# Patient Record
Sex: Female | Born: 1935 | Race: White | Hispanic: No | Smoking: Former smoker
Health system: Southern US, Community
[De-identification: ages and names within clinical notes are randomized; demographics above are authoritative.]

## PROBLEM LIST (undated history)

## (undated) DIAGNOSIS — M810 Age-related osteoporosis without current pathological fracture: Secondary | ICD-10-CM

## (undated) DIAGNOSIS — I639 Cerebral infarction, unspecified: Secondary | ICD-10-CM

## (undated) DIAGNOSIS — I10 Essential (primary) hypertension: Secondary | ICD-10-CM

## (undated) DIAGNOSIS — K219 Gastro-esophageal reflux disease without esophagitis: Secondary | ICD-10-CM

## (undated) DIAGNOSIS — Z9989 Dependence on other enabling machines and devices: Secondary | ICD-10-CM

## (undated) HISTORY — PX: ABDOMINAL HYSTERECTOMY: SHX81

---

## 2004-08-21 ENCOUNTER — Ambulatory Visit: Payer: Self-pay | Admitting: Family Medicine

## 2004-11-28 ENCOUNTER — Ambulatory Visit: Payer: Self-pay | Admitting: Unknown Physician Specialty

## 2005-09-11 ENCOUNTER — Ambulatory Visit: Payer: Self-pay | Admitting: Family Medicine

## 2005-11-28 ENCOUNTER — Ambulatory Visit: Payer: Self-pay | Admitting: Ophthalmology

## 2005-12-03 ENCOUNTER — Ambulatory Visit: Payer: Self-pay | Admitting: Ophthalmology

## 2006-09-18 ENCOUNTER — Ambulatory Visit: Payer: Self-pay | Admitting: Family Medicine

## 2007-11-05 ENCOUNTER — Ambulatory Visit: Payer: Self-pay | Admitting: Family Medicine

## 2008-02-24 ENCOUNTER — Ambulatory Visit: Payer: Self-pay | Admitting: Family Medicine

## 2008-03-10 ENCOUNTER — Ambulatory Visit: Payer: Self-pay | Admitting: Gastroenterology

## 2008-12-22 ENCOUNTER — Inpatient Hospital Stay: Payer: Self-pay | Admitting: Internal Medicine

## 2009-01-04 ENCOUNTER — Ambulatory Visit: Payer: Self-pay | Admitting: Family Medicine

## 2009-01-11 ENCOUNTER — Encounter: Payer: Self-pay | Admitting: Family Medicine

## 2009-01-16 ENCOUNTER — Ambulatory Visit: Payer: Self-pay | Admitting: Family Medicine

## 2009-01-21 ENCOUNTER — Encounter: Payer: Self-pay | Admitting: Family Medicine

## 2009-06-12 ENCOUNTER — Inpatient Hospital Stay: Payer: Self-pay | Admitting: Internal Medicine

## 2009-12-12 ENCOUNTER — Ambulatory Visit: Payer: Self-pay | Admitting: Family Medicine

## 2009-12-18 ENCOUNTER — Inpatient Hospital Stay: Payer: Self-pay | Admitting: Internal Medicine

## 2010-01-05 ENCOUNTER — Ambulatory Visit: Payer: Self-pay | Admitting: Family Medicine

## 2010-01-29 ENCOUNTER — Ambulatory Visit: Payer: Self-pay | Admitting: Gastroenterology

## 2010-02-05 ENCOUNTER — Ambulatory Visit: Payer: Self-pay | Admitting: Family Medicine

## 2010-04-18 ENCOUNTER — Ambulatory Visit: Payer: Self-pay | Admitting: Family Medicine

## 2010-08-23 ENCOUNTER — Ambulatory Visit: Payer: Self-pay | Admitting: Internal Medicine

## 2010-09-11 ENCOUNTER — Ambulatory Visit: Payer: Self-pay | Admitting: Gastroenterology

## 2010-09-13 LAB — PATHOLOGY REPORT

## 2010-11-27 ENCOUNTER — Observation Stay: Payer: Self-pay | Admitting: Specialist

## 2011-02-22 ENCOUNTER — Other Ambulatory Visit: Payer: Self-pay | Admitting: Ophthalmology

## 2011-03-05 ENCOUNTER — Ambulatory Visit: Payer: Self-pay | Admitting: Gastroenterology

## 2011-04-09 ENCOUNTER — Emergency Department: Payer: Self-pay | Admitting: Unknown Physician Specialty

## 2011-04-30 ENCOUNTER — Ambulatory Visit: Payer: Self-pay | Admitting: Gastroenterology

## 2011-05-07 ENCOUNTER — Ambulatory Visit: Payer: Self-pay | Admitting: Family Medicine

## 2011-05-09 ENCOUNTER — Ambulatory Visit: Payer: Self-pay | Admitting: Family Medicine

## 2011-06-01 IMAGING — CR DG LUMBAR SPINE 2-3V
1 series · 3 of 3 positions shown · non-contrast
Comparison: none

REASON FOR EXAM: back pain osteopenia
COMMENTS:

[Series 1: view not recorded · 0.17mm/px · 3 of 3 slices shown]
[im 1/3]
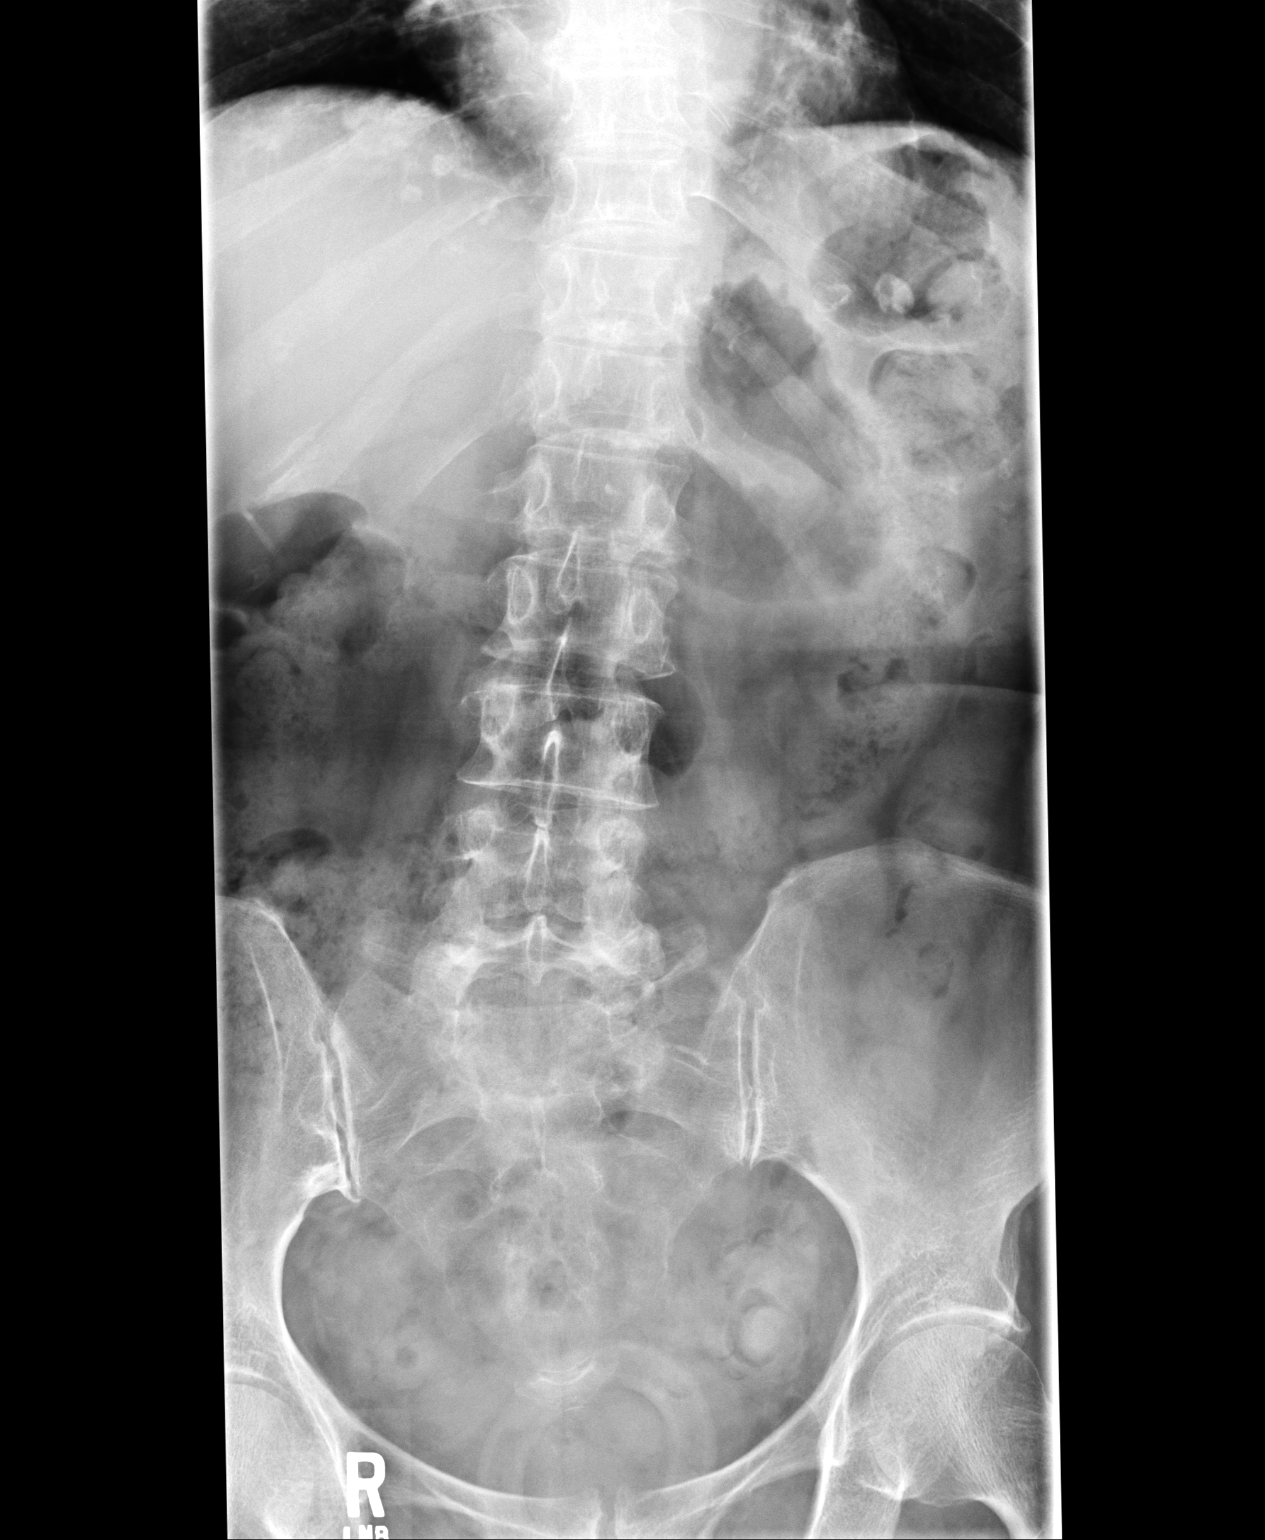
[im 2/3]
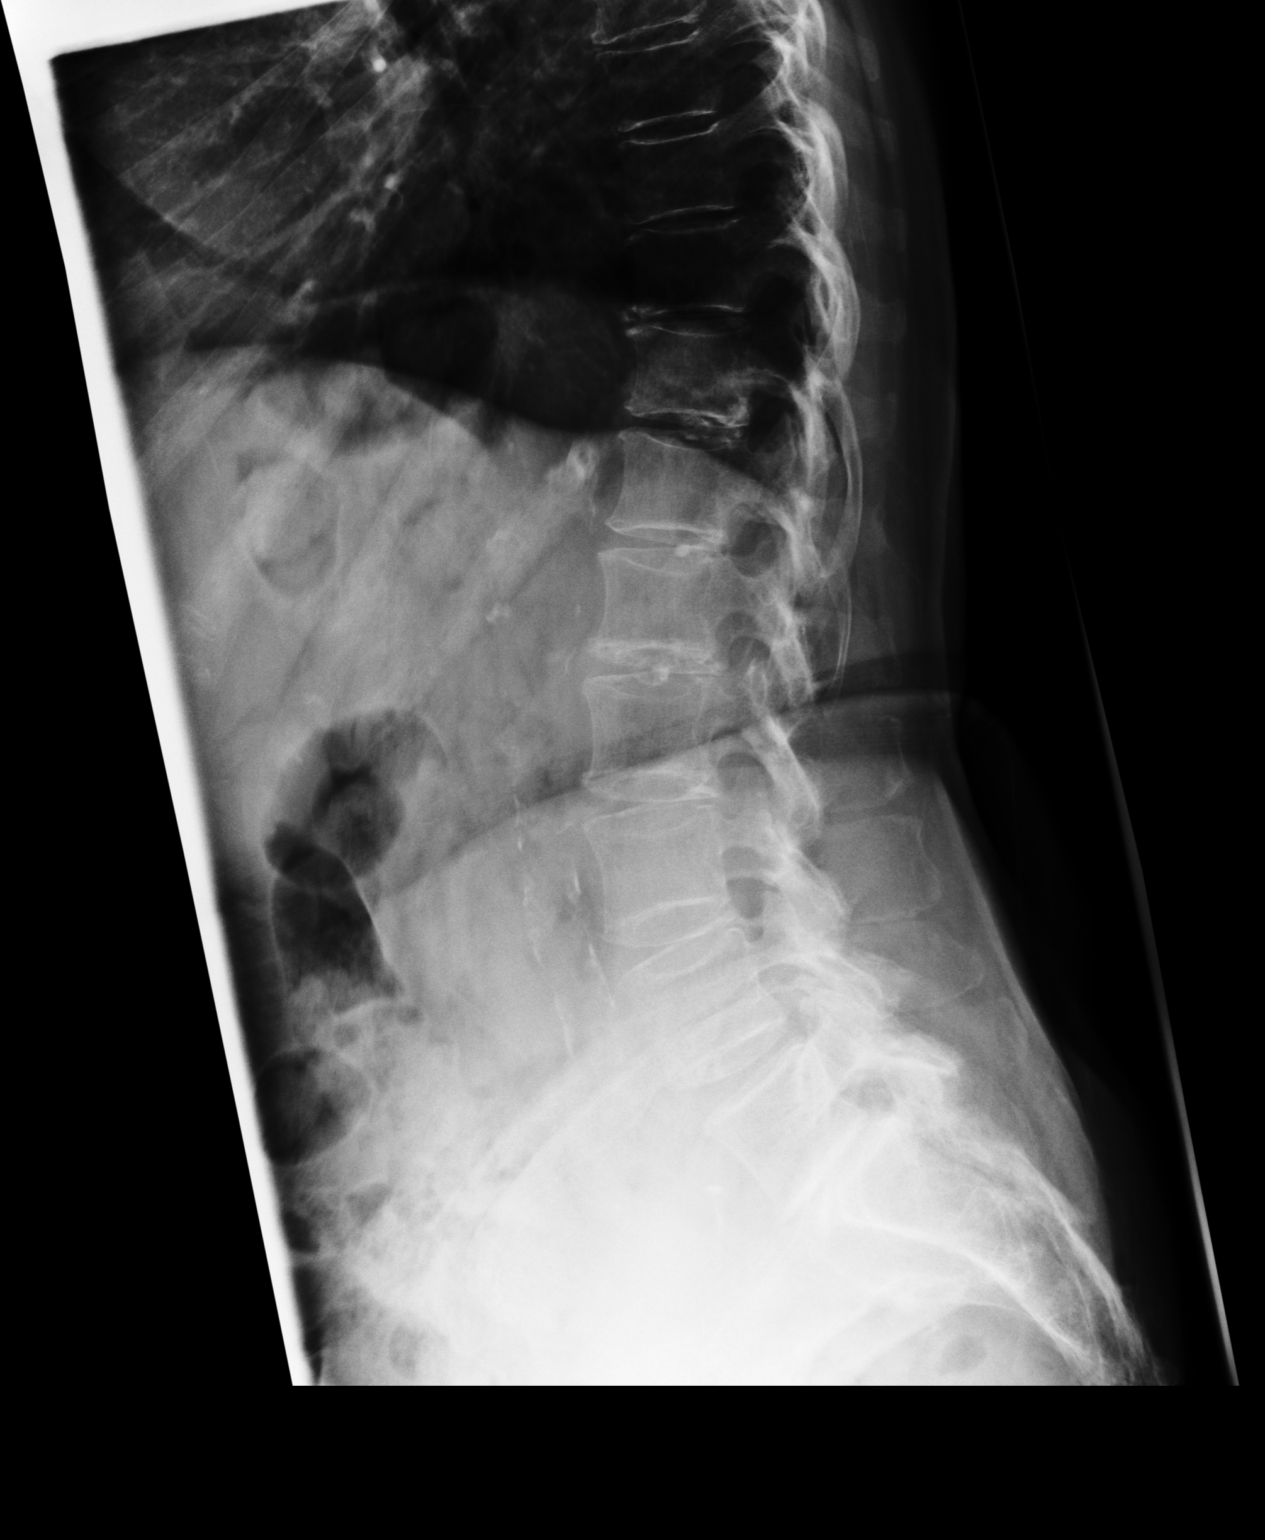
[im 3/3]
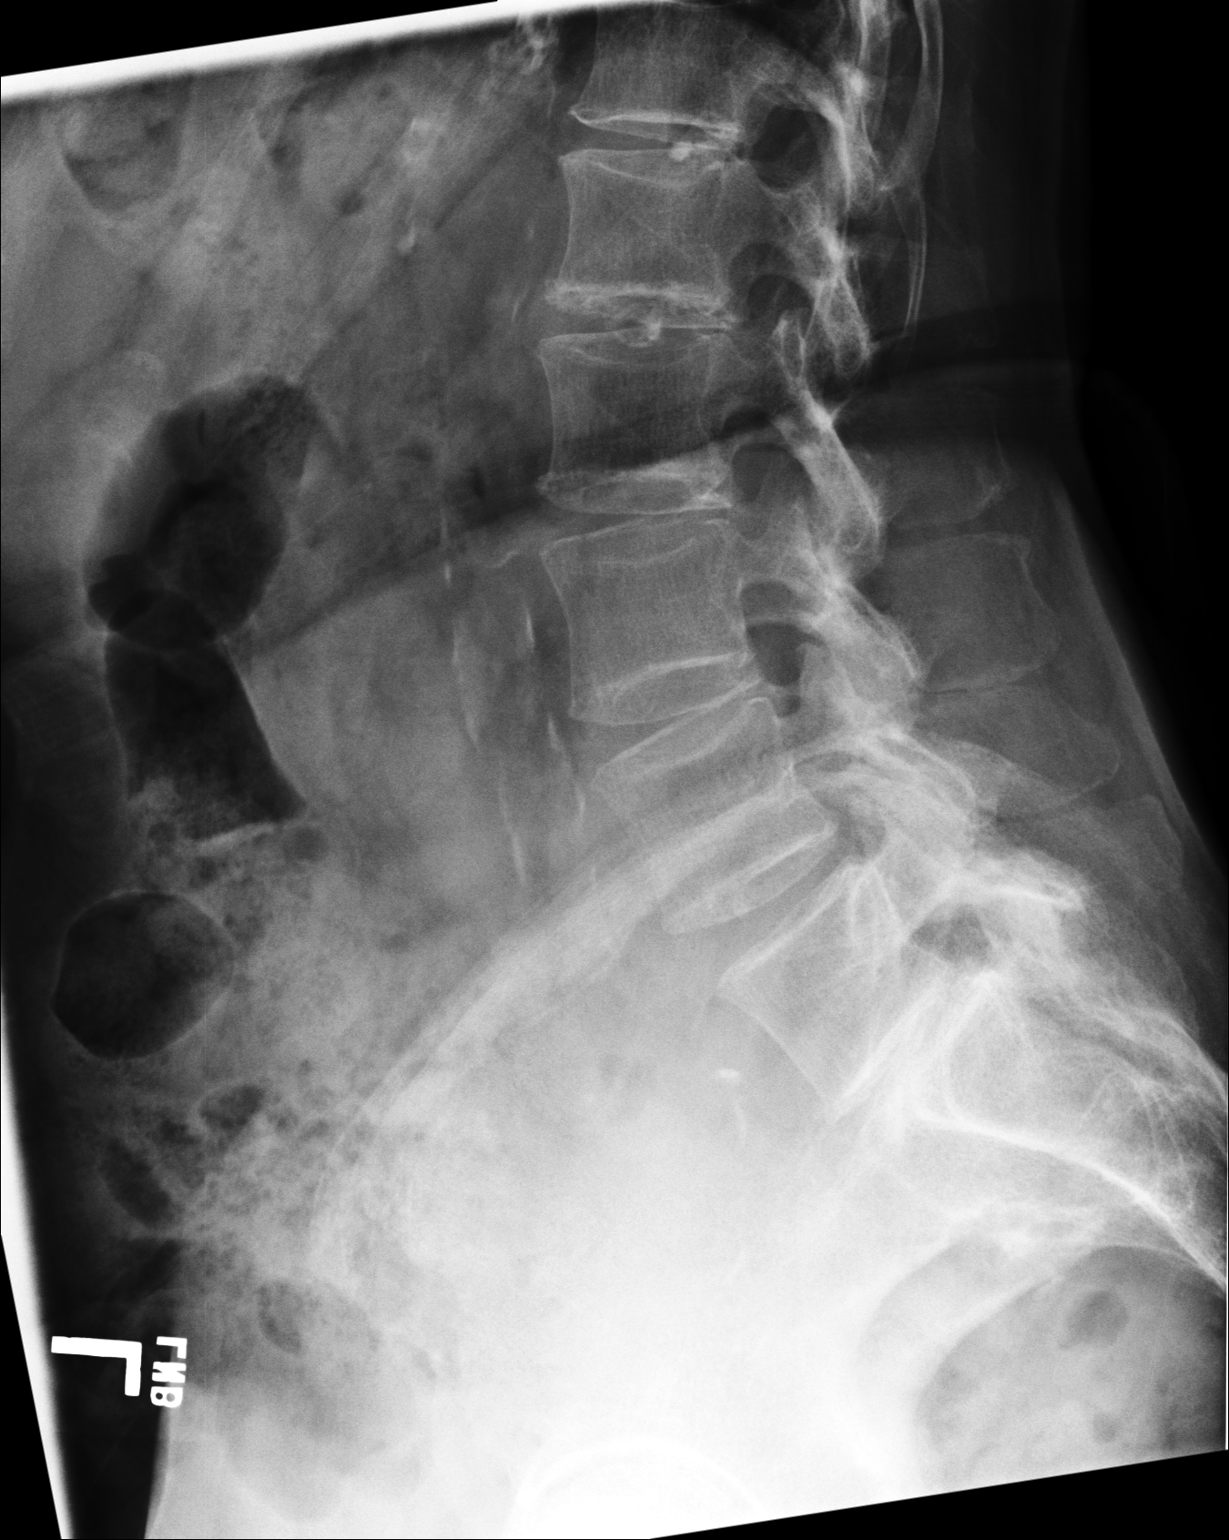

[3 of 3 positions shown; findings below may reference images not displayed]

PROCEDURE:     DXR - DXR LUMBAR SPINE AP AND LATERAL  - December 12, 2009 [DATE]

RESULT:     AP and lateral images demonstrate disc space narrowing at L5-S1
with no significant compression deformity. Atherosclerotic calcification and
facet hypertrophy are present. No definite anterolisthesis is present. There
is no congenital abnormality appreciated.
IMPRESSION: Degenerative changes in the lumbosacral region and lumbar
spine as described.

## 2011-06-01 IMAGING — CR CERVICAL SPINE - COMPLETE 4+ VIEW
1 series · 9 of 9 positions shown · non-contrast
Comparison: none

REASON FOR EXAM: back pain osteopenia
COMMENTS:

[Series 1: view not recorded · 0.17mm/px · 9 of 9 slices shown]
[im 1/9]
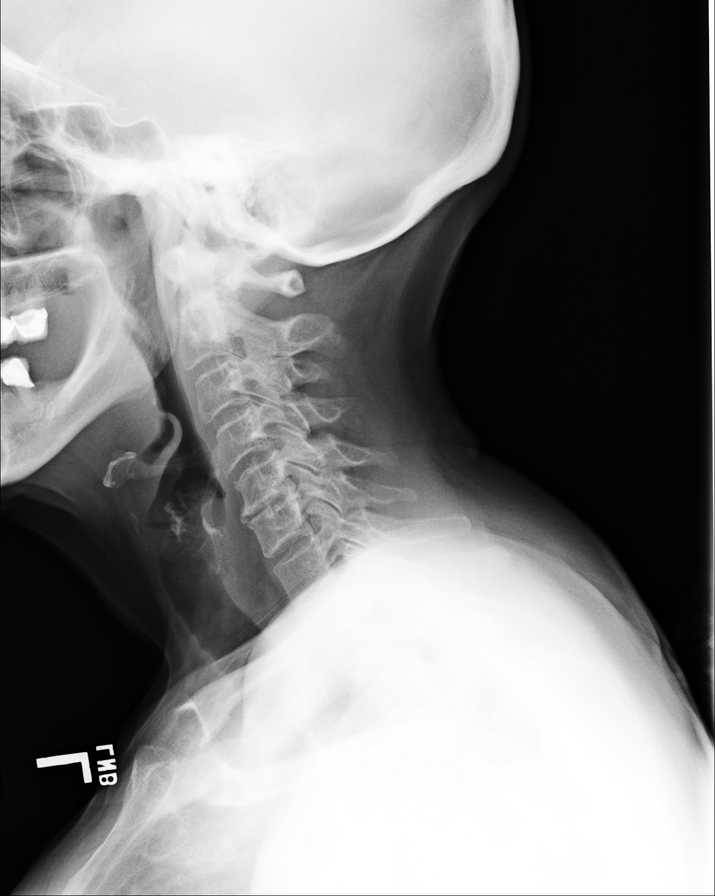
[im 2/9]
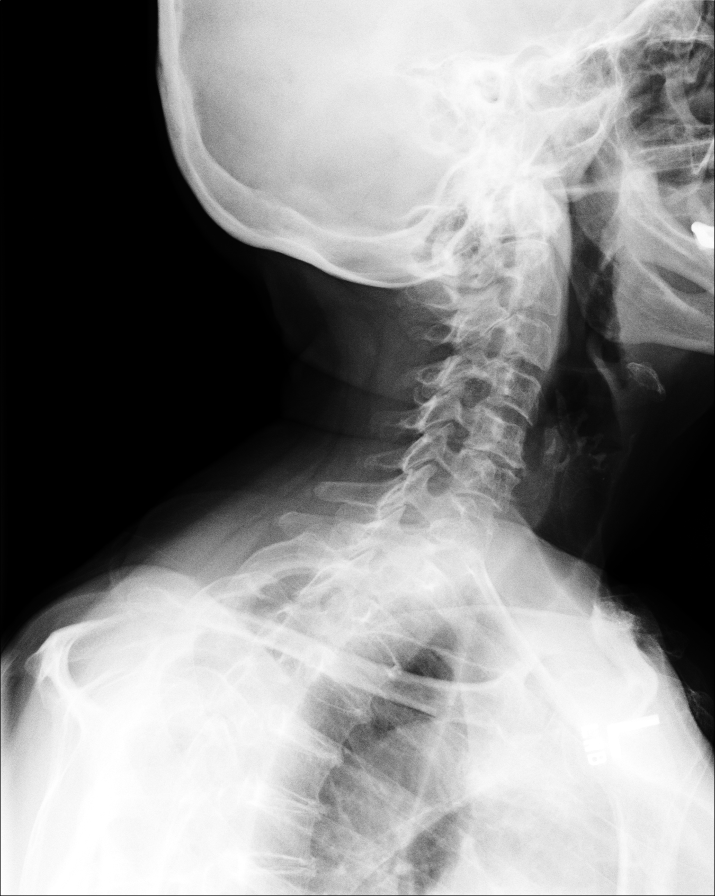
[im 3/9]
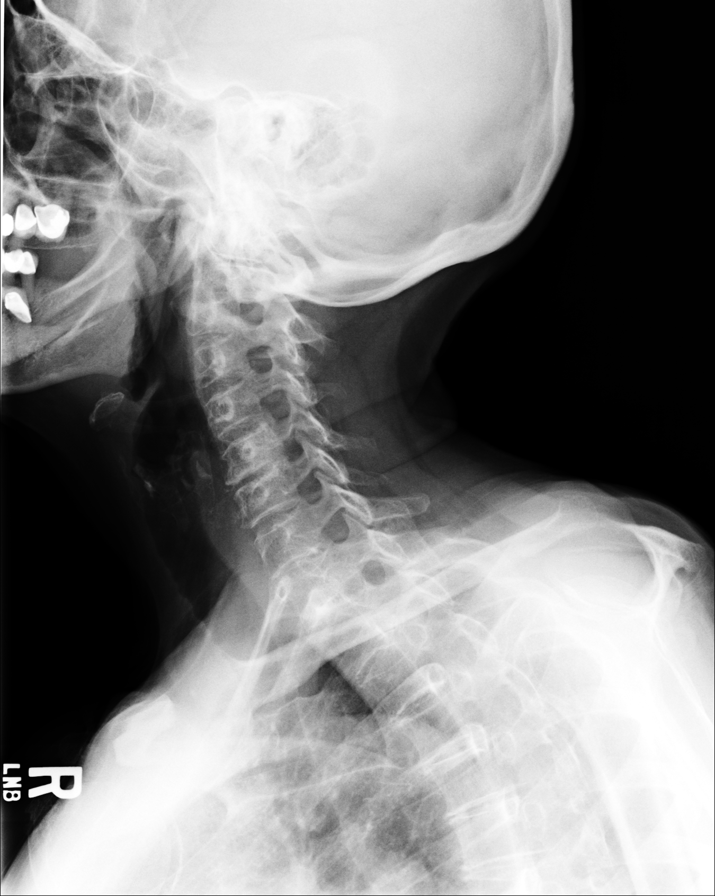
[im 4/9]
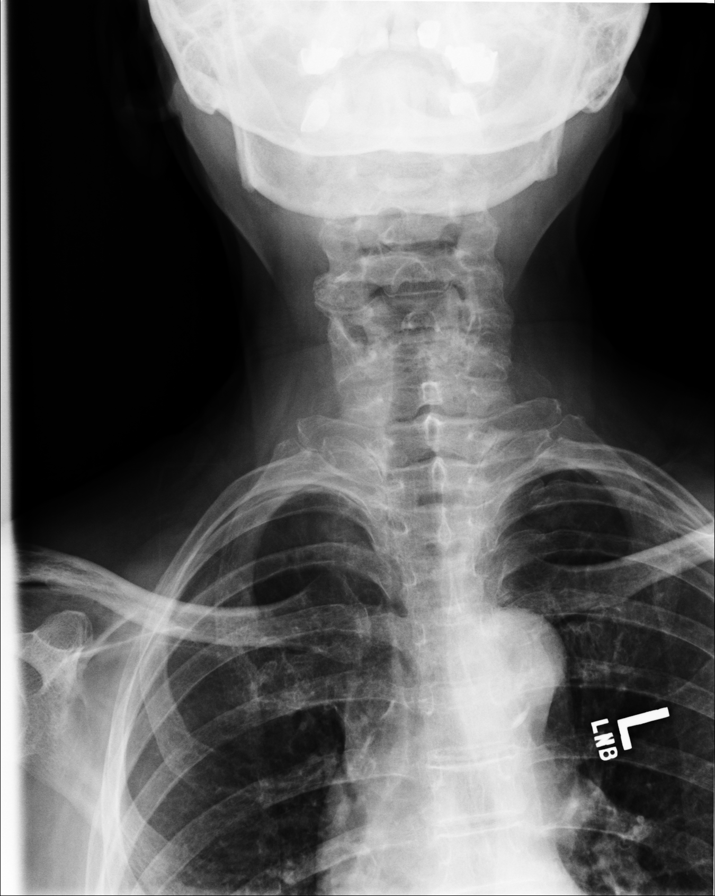
[im 5/9]
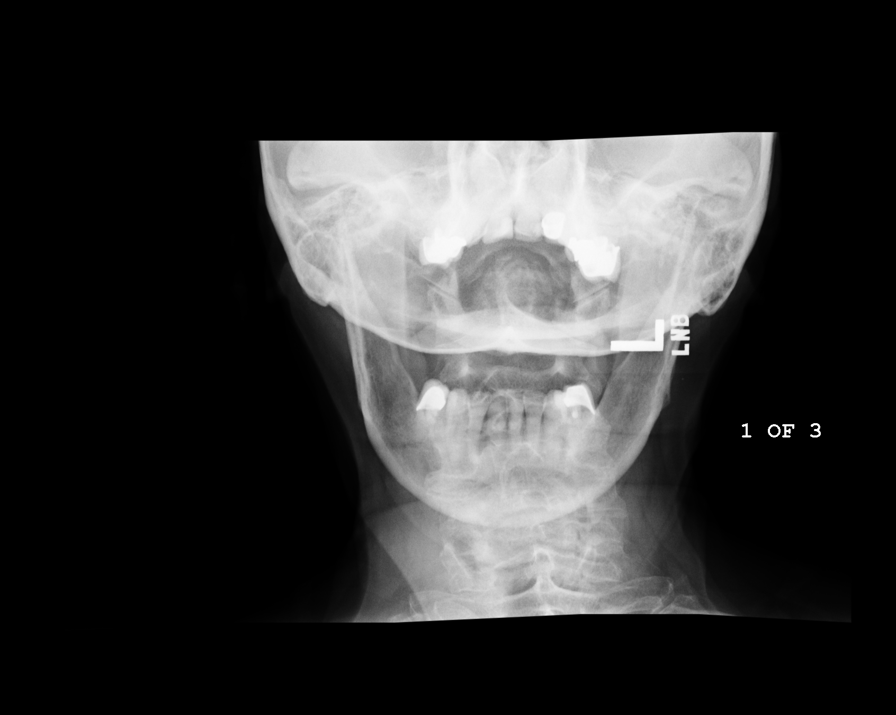
[im 6/9]
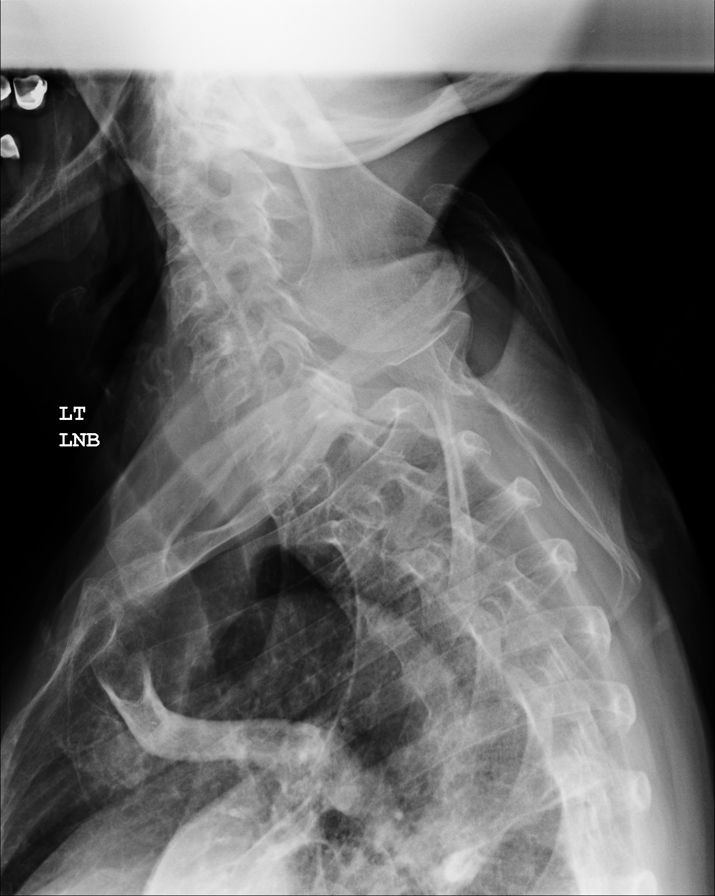
[im 7/9]
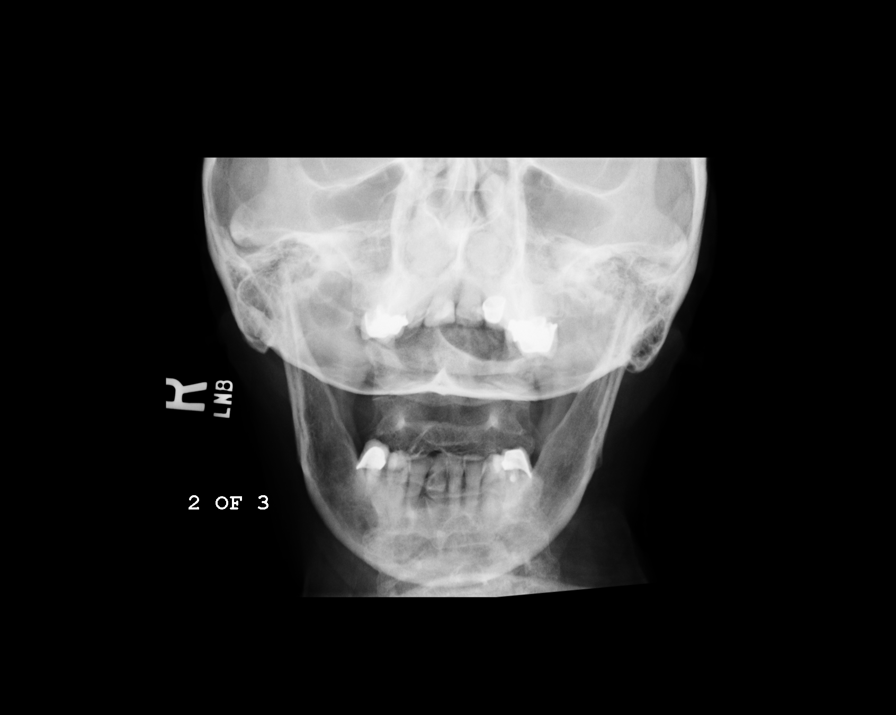
[im 8/9]
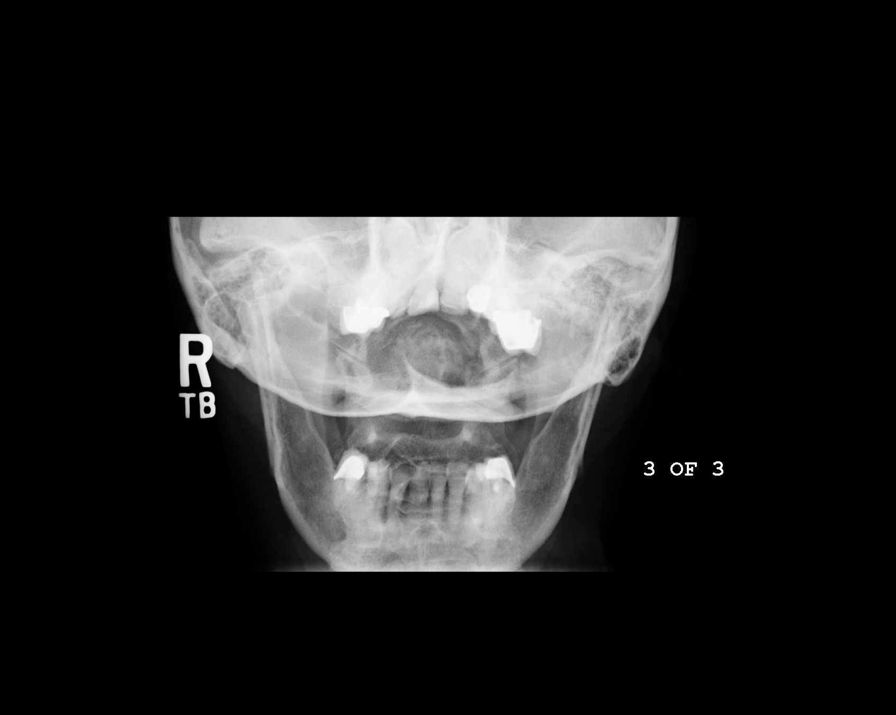
[im 9/9]
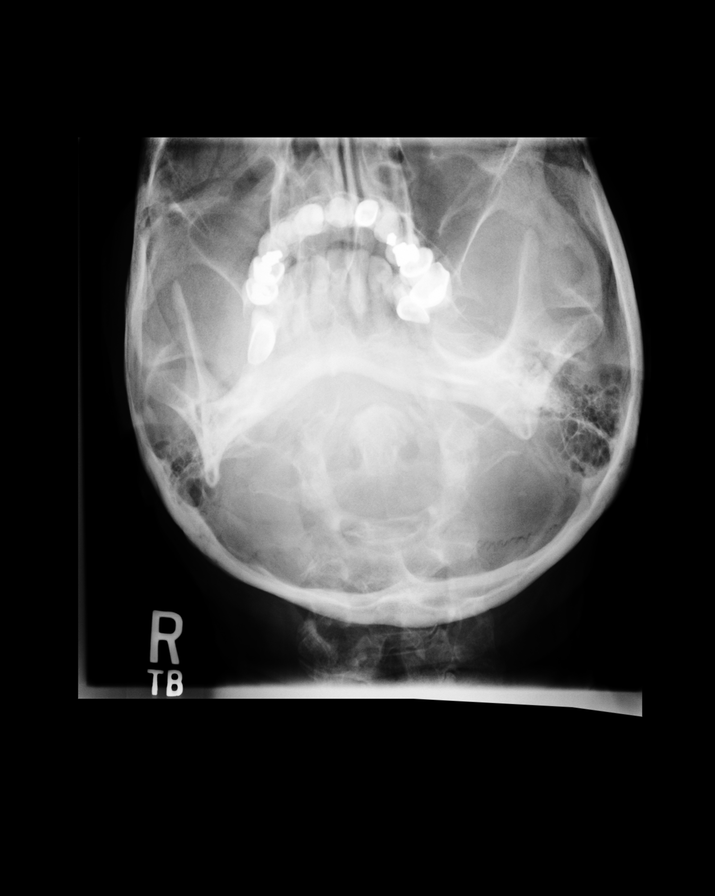

[9 of 9 positions shown; findings below may reference images not displayed]

PROCEDURE:     DXR - DXR CERVICAL SPINE COMPLETE  - December 12, 2009 [DATE]

RESULT:     There is C5-C6 disc space narrowing with hypertrophic endplate
spurring present. No significant bony encroachment on the foramina is
evident. The odontoid is poorly seen. The atlantoaxial alignment is
difficult to demonstrate.
IMPRESSION: Degenerative changes without acute bony abnormality.

## 2011-08-12 ENCOUNTER — Ambulatory Visit: Payer: Self-pay | Admitting: Family Medicine

## 2011-09-02 ENCOUNTER — Ambulatory Visit: Payer: Self-pay | Admitting: Family Medicine

## 2011-09-04 ENCOUNTER — Ambulatory Visit: Payer: Self-pay | Admitting: Family Medicine

## 2011-11-01 ENCOUNTER — Ambulatory Visit: Payer: Self-pay | Admitting: Gastroenterology

## 2011-11-06 LAB — PATHOLOGY REPORT

## 2011-12-02 ENCOUNTER — Ambulatory Visit: Payer: Self-pay | Admitting: Gastroenterology

## 2012-04-10 ENCOUNTER — Emergency Department: Payer: Self-pay | Admitting: *Deleted

## 2012-04-10 LAB — URINALYSIS, COMPLETE
Bilirubin,UR: NEGATIVE
Glucose,UR: NEGATIVE mg/dL (ref 0–75)
Ketone: NEGATIVE
Protein: NEGATIVE
Specific Gravity: 1.005 (ref 1.003–1.030)

## 2012-04-10 LAB — COMPREHENSIVE METABOLIC PANEL
Alkaline Phosphatase: 60 U/L (ref 50–136)
Anion Gap: 8 (ref 7–16)
Bilirubin,Total: 0.7 mg/dL (ref 0.2–1.0)
Chloride: 95 mmol/L — ABNORMAL LOW (ref 98–107)
Co2: 28 mmol/L (ref 21–32)
Creatinine: 0.66 mg/dL (ref 0.60–1.30)
EGFR (Non-African Amer.): >60
Potassium: 4 mmol/L (ref 3.5–5.1)
SGOT(AST): 17 U/L (ref 15–37)
SGPT (ALT): 20 U/L

## 2012-04-10 LAB — CBC
HCT: 37.2 % (ref 35.0–47.0)
HGB: 12.5 g/dL (ref 12.0–16.0)
RBC: 4.12 10*6/uL (ref 3.80–5.20)
WBC: 12.2 10*3/uL — ABNORMAL HIGH (ref 3.6–11.0)

## 2012-06-16 ENCOUNTER — Ambulatory Visit: Payer: Self-pay | Admitting: Ophthalmology

## 2012-06-29 ENCOUNTER — Ambulatory Visit: Payer: Self-pay | Admitting: Ophthalmology

## 2012-10-17 IMAGING — CT CT ABD-PELV W/ CM
1 of 2 series · 14 of 32 positions shown, 18 images · non-contrast
Comparison: none

REASON FOR EXAM: nausea abd pain
COMMENTS:

[Series 2: abd with 5.0 i40f 3 · axial · 0.67mm/px · z∈[-914,-554]mm · 14 of 80 slices shown, 18 images]
[im 4/80  soft-tissue]
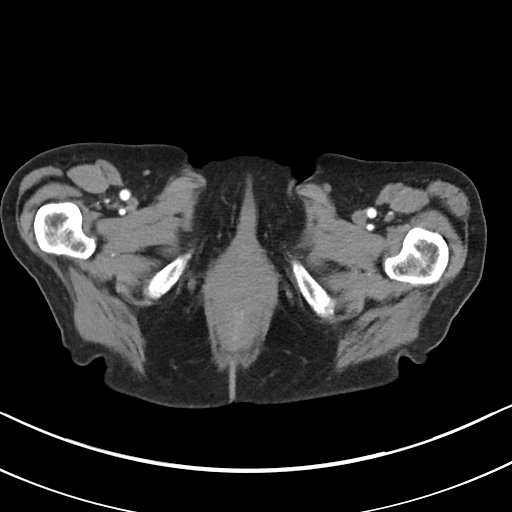
[im 4/80  bone]
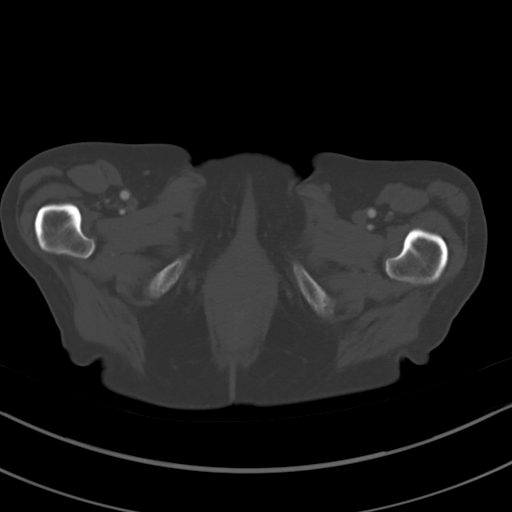
[im 11/80  soft-tissue]
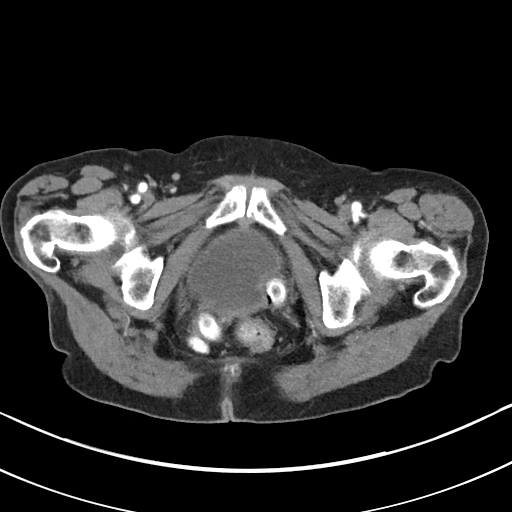
[im 18/80  soft-tissue]
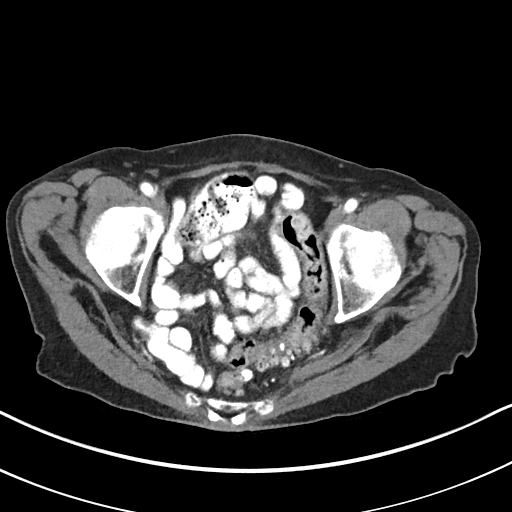
[im 25/80  soft-tissue]
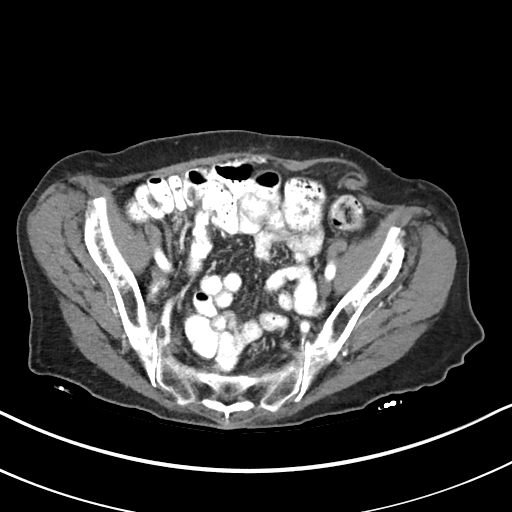
[im 31/80  soft-tissue]
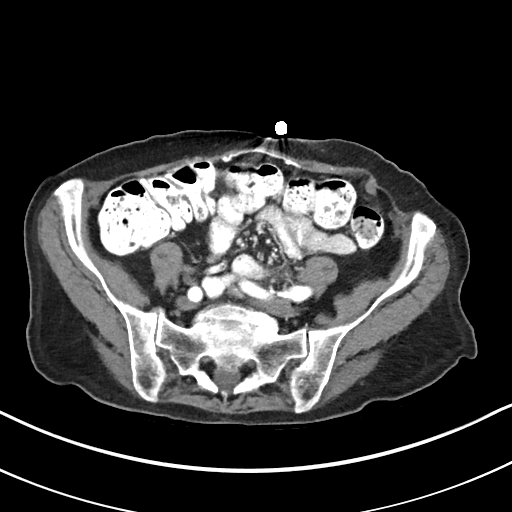
[im 38/80  soft-tissue]
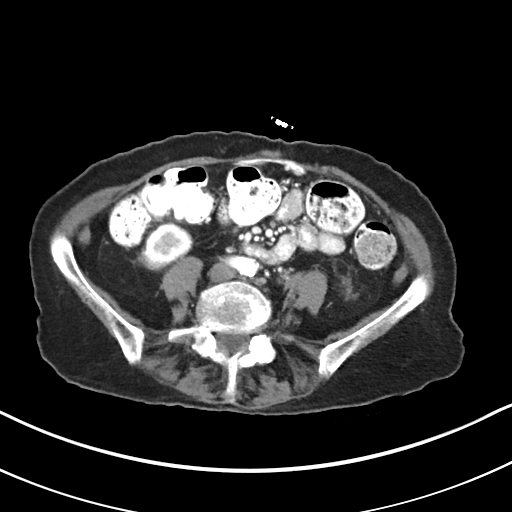
[im 42/80  soft-tissue]
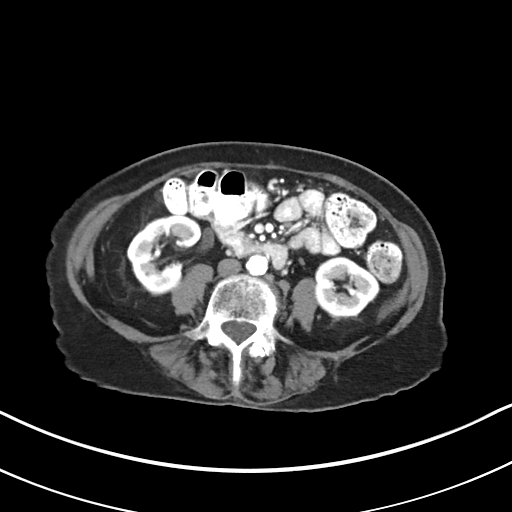
[im 49/80  soft-tissue]
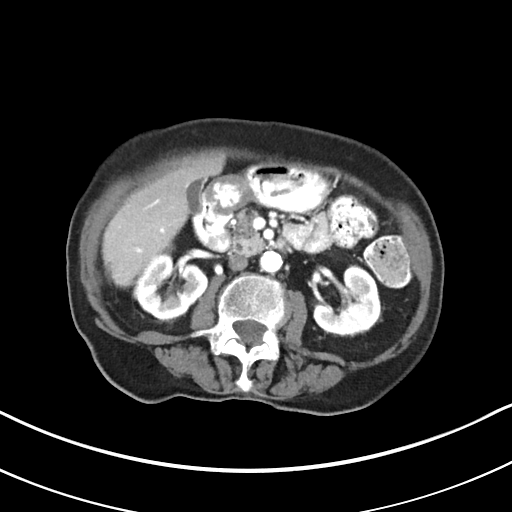
[im 55/80  soft-tissue]
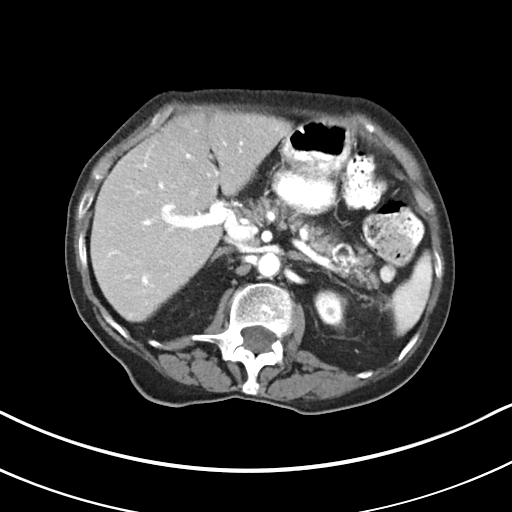
[im 55/80  bone]
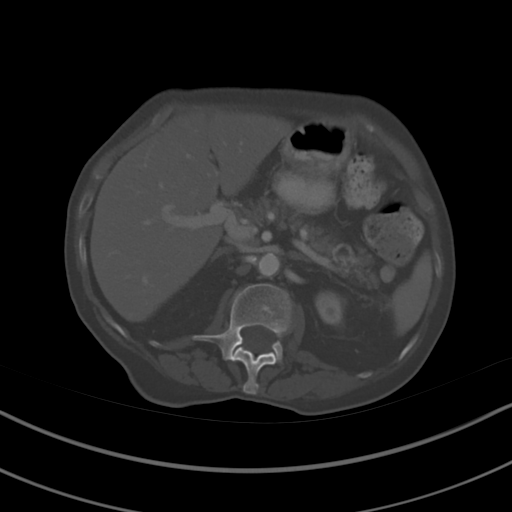
[im 62/80  soft-tissue]
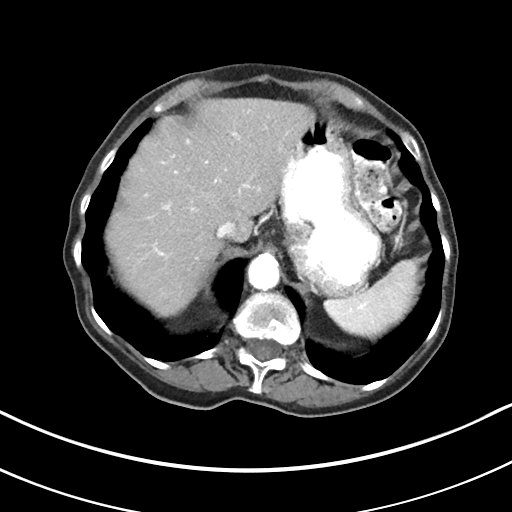
[im 66/80  lung]
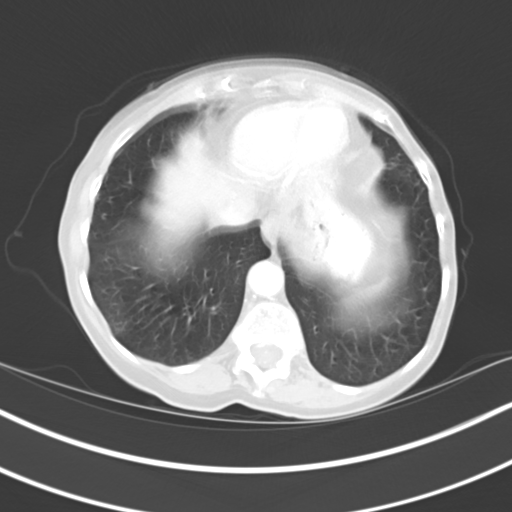
[im 69/80  soft-tissue]
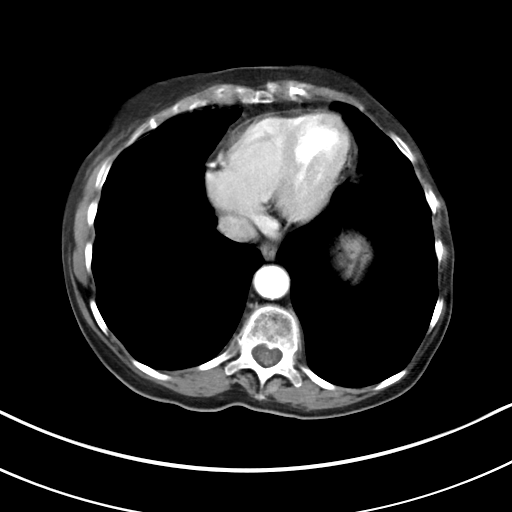
[im 69/80  lung]
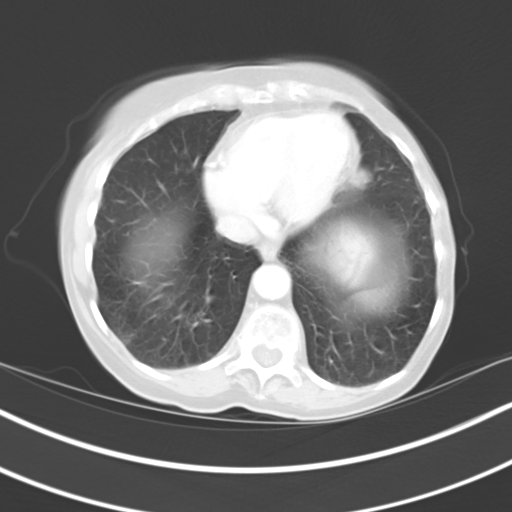
[im 73/80  lung]
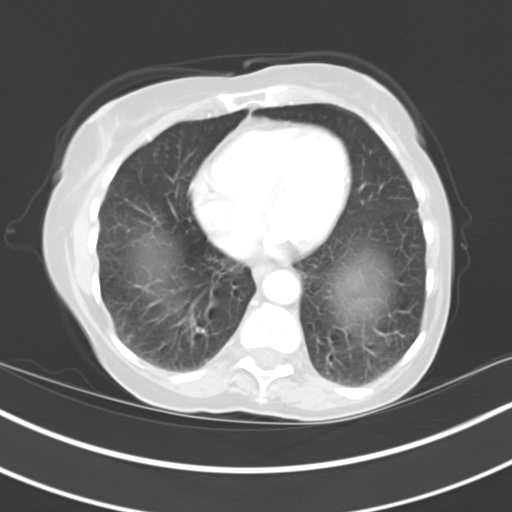
[im 76/80  soft-tissue]
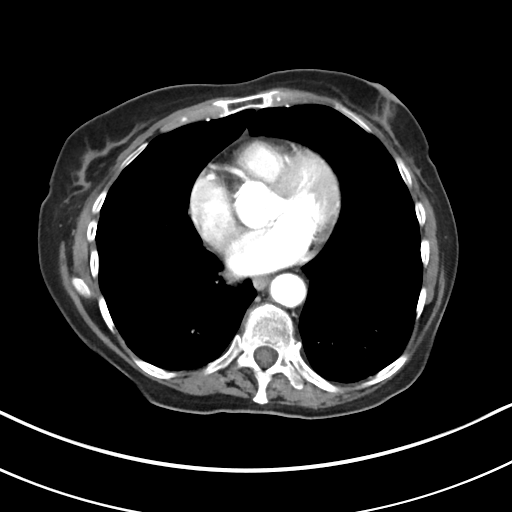
[im 76/80  lung]
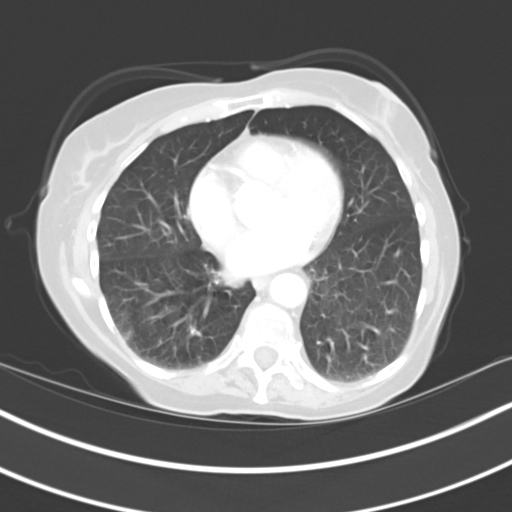

[14 of 32 positions shown; findings below may reference images not displayed]

PROCEDURE:     KCT - KCT ABDOMEN/PELVIS W  - April 30, 2011 [DATE]

RESULT:     Axial CT scanning was performed through the abdomen and pelvis
at 5 mm intervals and slice thicknesses following intravenous administration
of 80 cc of Dsovue-9CC. The patient also received oral contrast material.
Comparison is made to a study 23 August, 2010. Review of multiplanar
reconstructed images was performed separately on the VIA monitor.

The orally administered contrast has traversed the small bowel and has
reached the junction of the descending colon with the rectosigmoid. There is
no evidence of bowel obstruction nor ileus. There are exuberant sigmoid
diverticula present but I do not see objective evidence of acute
diverticulitis. There is no evidence of intra-abdominal pelvic abscess or
free fluid.

The liver, spleen, partially distended stomach, pancreas, and kidneys
exhibit no acute abnormality. I see no adrenal masses. The gallbladder is
only partially distended. No stones are evident or or nor is there evidence
of surrounding inflammatory change. Correlation with the timing of the
patient's most recent meal would be useful. One would expect the gallbladder
to be more distended if the patient had undergone an 8-12 hour fast. The
caliber of the abdominal aorta is within the limits of normal. I see no
periaortic or pericaval lymphadenopathy. The uterus is surgically absent.
There is a pessary in place. The partially distended urinary bladder is
grossly normal. I see no evidence of ascites. The lung bases exhibit no
acute abnormality or
IMPRESSION: 1. I see no evidence of gallstones nor objective evidence of other acute
hepatobiliary abnormality. The gallbladder is only partially distended. This
may be related to recent ingestion of a meal to chronic inflammation of the
gallbladder is not entirely excluded. Given the patient's symptoms it may be
useful to consider her for gallbladder ultrasound and a HIDA scan with
ejection fraction no stones are present.
2. I see no acute bowel abnormality nor acute urinary tract abnormality.
There is sigmoid diverticulosis without evidence of acute diverticulitis.

## 2013-06-03 ENCOUNTER — Emergency Department: Payer: Self-pay | Admitting: Emergency Medicine

## 2013-06-03 LAB — COMPREHENSIVE METABOLIC PANEL WITH GFR
Albumin: 3.8 g/dL
Alkaline Phosphatase: 68 U/L
Anion Gap: 6 — ABNORMAL LOW
BUN: 17 mg/dL
Bilirubin,Total: 0.7 mg/dL
Calcium, Total: 9 mg/dL
Chloride: 99 mmol/L
Co2: 27 mmol/L
Creatinine: 0.8 mg/dL
EGFR (African American): >60
EGFR (Non-African Amer.): >60
Glucose: 110 mg/dL — ABNORMAL HIGH
Osmolality: 267
Potassium: 3.8 mmol/L
SGOT(AST): 27 U/L
SGPT (ALT): 31 U/L
Sodium: 132 mmol/L — ABNORMAL LOW
Total Protein: 6.9 g/dL

## 2013-06-03 LAB — CBC
HCT: 33.9 % — ABNORMAL LOW
HGB: 11.8 g/dL — ABNORMAL LOW
MCH: 31.5 pg
MCHC: 34.9 g/dL
MCV: 90 fL
Platelet: 383 x10 3/mm 3
RBC: 3.74 X10 6/mm 3 — ABNORMAL LOW
RDW: 13.5 %
WBC: 9.1 x10 3/mm 3

## 2013-06-03 LAB — URINALYSIS, COMPLETE
Bilirubin,UR: NEGATIVE
Blood: NEGATIVE
Glucose,UR: NEGATIVE mg/dL (ref 0–75)
Leukocyte Esterase: NEGATIVE
Ph: 6 (ref 4.5–8.0)
Protein: NEGATIVE
RBC,UR: 2 /HPF (ref 0–5)
Specific Gravity: 1.017 (ref 1.003–1.030)
Squamous Epithelial: <1
WBC UR: 2 /HPF (ref 0–5)

## 2013-06-03 LAB — MAGNESIUM: Magnesium: 1.7 mg/dL — ABNORMAL LOW

## 2013-06-03 LAB — CK TOTAL AND CKMB (NOT AT ARMC)
CK, Total: 42 U/L
CK-MB: 1.2 ng/mL

## 2013-06-03 LAB — TROPONIN I: Troponin-I: <0.02 ng/mL

## 2013-06-03 LAB — SEDIMENTATION RATE: Erythrocyte Sed Rate: 5 mm/h

## 2013-07-29 ENCOUNTER — Ambulatory Visit: Payer: Self-pay | Admitting: Family Medicine

## 2013-08-25 ENCOUNTER — Inpatient Hospital Stay: Payer: Self-pay | Admitting: Internal Medicine

## 2013-08-25 LAB — CBC
MCH: 30.6 pg (ref 26.0–34.0)
MCHC: 33.7 g/dL (ref 32.0–36.0)
MCV: 91 fL (ref 80–100)
Platelet: 348 10*3/uL (ref 150–440)
RDW: 13.5 % (ref 11.5–14.5)
WBC: 17.2 10*3/uL — ABNORMAL HIGH (ref 3.6–11.0)

## 2013-08-25 LAB — COMPREHENSIVE METABOLIC PANEL
Alkaline Phosphatase: 50 U/L
Anion Gap: 10 (ref 7–16)
BUN: 14 mg/dL (ref 7–18)
Bilirubin,Total: 0.5 mg/dL (ref 0.2–1.0)
Calcium, Total: 8.9 mg/dL (ref 8.5–10.1)
Chloride: 98 mmol/L (ref 98–107)
Co2: 24 mmol/L (ref 21–32)
Creatinine: 0.48 mg/dL — ABNORMAL LOW (ref 0.60–1.30)
EGFR (African American): >60
Potassium: 3.4 mmol/L — ABNORMAL LOW (ref 3.5–5.1)
SGOT(AST): 18 U/L (ref 15–37)
SGPT (ALT): 29 U/L (ref 12–78)
Sodium: 132 mmol/L — ABNORMAL LOW (ref 136–145)
Total Protein: 6.2 g/dL — ABNORMAL LOW (ref 6.4–8.2)

## 2013-08-25 LAB — URINALYSIS, COMPLETE
Glucose,UR: NEGATIVE mg/dL (ref 0–75)
Ketone: NEGATIVE
Leukocyte Esterase: NEGATIVE
Nitrite: NEGATIVE
Protein: NEGATIVE
Specific Gravity: 1.009 (ref 1.003–1.030)
Squamous Epithelial: <1

## 2013-08-25 LAB — CK TOTAL AND CKMB (NOT AT ARMC): CK, Total: 67 U/L (ref 21–215)

## 2013-08-25 LAB — SALICYLATE LEVEL: Salicylates, Serum: <1.7 mg/dL

## 2013-08-25 LAB — PROTIME-INR: Prothrombin Time: 13.1 secs (ref 11.5–14.7)

## 2013-08-25 LAB — TROPONIN I: Troponin-I: <0.02 ng/mL

## 2013-08-26 LAB — CBC WITH DIFFERENTIAL/PLATELET
Eosinophil #: 0.1 10*3/uL (ref 0.0–0.7)
Eosinophil %: 0.6 %
HCT: 30 % — ABNORMAL LOW (ref 35.0–47.0)
HGB: 10.3 g/dL — ABNORMAL LOW (ref 12.0–16.0)
MCHC: 34.2 g/dL (ref 32.0–36.0)
MCV: 91 fL (ref 80–100)
Monocyte #: 1.4 x10 3/mm — ABNORMAL HIGH (ref 0.2–0.9)
Neutrophil %: 77.6 %
Platelet: 298 10*3/uL (ref 150–440)
RBC: 3.3 10*6/uL — ABNORMAL LOW (ref 3.80–5.20)
RDW: 13.4 % (ref 11.5–14.5)
WBC: 15.6 10*3/uL — ABNORMAL HIGH (ref 3.6–11.0)

## 2013-08-26 LAB — BASIC METABOLIC PANEL
BUN: 5 mg/dL — ABNORMAL LOW (ref 7–18)
Calcium, Total: 7.9 mg/dL — ABNORMAL LOW (ref 8.5–10.1)
Co2: 25 mmol/L (ref 21–32)
EGFR (African American): >60
EGFR (Non-African Amer.): >60
Glucose: 95 mg/dL (ref 65–99)
Osmolality: 267 (ref 275–301)

## 2013-08-27 LAB — CLOSTRIDIUM DIFFICILE(ARMC)

## 2013-08-27 LAB — POTASSIUM: Potassium: 3.1 mmol/L — ABNORMAL LOW (ref 3.5–5.1)

## 2013-08-28 LAB — STOOL CULTURE

## 2013-08-28 LAB — POTASSIUM: Potassium: 4 mmol/L (ref 3.5–5.1)

## 2013-08-30 LAB — CULTURE, BLOOD (SINGLE)

## 2014-03-17 LAB — COMPREHENSIVE METABOLIC PANEL
ALBUMIN: 4 g/dL (ref 3.4–5.0)
AST: 17 U/L (ref 15–37)
Alkaline Phosphatase: 59 U/L
Anion Gap: 9 (ref 7–16)
BILIRUBIN TOTAL: 0.6 mg/dL (ref 0.2–1.0)
BUN: 18 mg/dL (ref 7–18)
CALCIUM: 9.1 mg/dL (ref 8.5–10.1)
CHLORIDE: 98 mmol/L (ref 98–107)
CREATININE: 0.57 mg/dL — AB (ref 0.60–1.30)
Co2: 24 mmol/L (ref 21–32)
GLUCOSE: 170 mg/dL — AB (ref 65–99)
Osmolality: 269 (ref 275–301)
POTASSIUM: 3.8 mmol/L (ref 3.5–5.1)
SGPT (ALT): 22 U/L (ref 12–78)
Sodium: 131 mmol/L — ABNORMAL LOW (ref 136–145)
TOTAL PROTEIN: 6.8 g/dL (ref 6.4–8.2)

## 2014-03-17 LAB — CBC WITH DIFFERENTIAL/PLATELET
BASOS PCT: 0.3 %
Basophil #: 0.1 10*3/uL (ref 0.0–0.1)
EOS ABS: 0 10*3/uL (ref 0.0–0.7)
EOS PCT: 0 %
HCT: 34.9 % — AB (ref 35.0–47.0)
HGB: 11.7 g/dL — AB (ref 12.0–16.0)
LYMPHS ABS: 0.6 10*3/uL — AB (ref 1.0–3.6)
LYMPHS PCT: 3 %
MCH: 31.2 pg (ref 26.0–34.0)
MCHC: 33.6 g/dL (ref 32.0–36.0)
MCV: 93 fL (ref 80–100)
Monocyte #: 1 x10 3/mm — ABNORMAL HIGH (ref 0.2–0.9)
Monocyte %: 5 %
Neutrophil #: 18.9 10*3/uL — ABNORMAL HIGH (ref 1.4–6.5)
Neutrophil %: 91.7 %
PLATELETS: 399 10*3/uL (ref 150–440)
RBC: 3.76 10*6/uL — AB (ref 3.80–5.20)
RDW: 13.8 % (ref 11.5–14.5)
WBC: 20.6 10*3/uL — AB (ref 3.6–11.0)

## 2014-03-17 LAB — TROPONIN I: Troponin-I: <0.02 ng/mL

## 2014-03-18 LAB — HEMOGLOBIN
HGB: 10.9 g/dL — ABNORMAL LOW (ref 12.0–16.0)
HGB: 10.4 g/dL — ABNORMAL LOW (ref 12.0–16.0)

## 2014-03-18 LAB — CLOSTRIDIUM DIFFICILE(ARMC)

## 2014-03-18 LAB — URINALYSIS, COMPLETE
Bilirubin,UR: NEGATIVE
Blood: NEGATIVE
Glucose,UR: NEGATIVE mg/dL (ref 0–75)
Ketone: NEGATIVE
LEUKOCYTE ESTERASE: NEGATIVE
NITRITE: NEGATIVE
PH: 7 (ref 4.5–8.0)
Protein: 25
RBC,UR: 5 /HPF (ref 0–5)
SPECIFIC GRAVITY: 1.01 (ref 1.003–1.030)

## 2014-03-19 ENCOUNTER — Inpatient Hospital Stay: Payer: Self-pay | Admitting: Internal Medicine

## 2014-03-19 LAB — CBC WITH DIFFERENTIAL/PLATELET
Basophil #: 0 10*3/uL (ref 0.0–0.1)
Basophil %: 0.2 %
Eosinophil #: 0.1 10*3/uL (ref 0.0–0.7)
Eosinophil %: 1.1 %
HCT: 27.9 % — ABNORMAL LOW (ref 35.0–47.0)
HGB: 9.7 g/dL — ABNORMAL LOW (ref 12.0–16.0)
LYMPHS ABS: 2.2 10*3/uL (ref 1.0–3.6)
Lymphocyte %: 22.5 %
MCH: 32 pg (ref 26.0–34.0)
MCHC: 34.6 g/dL (ref 32.0–36.0)
MCV: 92 fL (ref 80–100)
MONO ABS: 1.1 x10 3/mm — AB (ref 0.2–0.9)
MONOS PCT: 10.9 %
Neutrophil #: 6.5 10*3/uL (ref 1.4–6.5)
Neutrophil %: 65.3 %
Platelet: 304 10*3/uL (ref 150–440)
RBC: 3.02 10*6/uL — AB (ref 3.80–5.20)
RDW: 13.6 % (ref 11.5–14.5)
WBC: 9.9 10*3/uL (ref 3.6–11.0)

## 2014-03-19 LAB — BASIC METABOLIC PANEL
Anion Gap: 7 (ref 7–16)
BUN: 5 mg/dL — ABNORMAL LOW (ref 7–18)
CALCIUM: 7.8 mg/dL — AB (ref 8.5–10.1)
CHLORIDE: 103 mmol/L (ref 98–107)
Co2: 24 mmol/L (ref 21–32)
Creatinine: 0.58 mg/dL — ABNORMAL LOW (ref 0.60–1.30)
EGFR (Non-African Amer.): >60
Glucose: 71 mg/dL (ref 65–99)
OSMOLALITY: 264 (ref 275–301)
Potassium: 3.4 mmol/L — ABNORMAL LOW (ref 3.5–5.1)
SODIUM: 134 mmol/L — AB (ref 136–145)

## 2014-03-19 LAB — MAGNESIUM: Magnesium: 1.7 mg/dL — ABNORMAL LOW

## 2014-04-06 ENCOUNTER — Other Ambulatory Visit: Payer: Self-pay | Admitting: Gastroenterology

## 2014-04-06 LAB — CLOSTRIDIUM DIFFICILE(ARMC)

## 2014-04-19 ENCOUNTER — Ambulatory Visit: Payer: Self-pay | Admitting: Family Medicine

## 2014-12-22 ENCOUNTER — Inpatient Hospital Stay
Admit: 2014-12-22 | Disposition: A | Discharge status: Home / Self Care | Payer: Self-pay | Attending: Internal Medicine | Admitting: Internal Medicine

## 2014-12-22 ENCOUNTER — Ambulatory Visit: Admit: 2014-12-22 | Disposition: A | Discharge status: Home / Self Care | Payer: Self-pay | Admitting: Neurology

## 2014-12-22 LAB — COMPREHENSIVE METABOLIC PANEL
ANION GAP: 8 (ref 7–16)
Albumin: 3.3 g/dL — ABNORMAL LOW
Alkaline Phosphatase: 73 U/L
BUN: 16 mg/dL
Bilirubin,Total: 0.6 mg/dL
CALCIUM: 8.2 mg/dL — AB
CHLORIDE: 101 mmol/L
CO2: 21 mmol/L — AB
Creatinine: 0.69 mg/dL
EGFR (Non-African Amer.): >60
GLUCOSE: 168 mg/dL — AB
Potassium: 3.4 mmol/L — ABNORMAL LOW
SGOT(AST): 19 U/L
SGPT (ALT): 14 U/L
SODIUM: 130 mmol/L — AB
Total Protein: 5.4 g/dL — ABNORMAL LOW

## 2014-12-22 LAB — CBC WITH DIFFERENTIAL/PLATELET
Basophil #: 0.1 10*3/uL (ref 0.0–0.1)
Basophil %: 0.5 %
Eosinophil #: 0.2 10*3/uL (ref 0.0–0.7)
Eosinophil %: 1.3 %
HCT: 32.3 % — ABNORMAL LOW (ref 35.0–47.0)
HGB: 10.4 g/dL — AB (ref 12.0–16.0)
Lymphocyte #: 1.8 10*3/uL (ref 1.0–3.6)
Lymphocyte %: 10.3 %
MCH: 29 pg (ref 26.0–34.0)
MCHC: 32.1 g/dL (ref 32.0–36.0)
MCV: 90 fL (ref 80–100)
Monocyte #: 1.2 x10 3/mm — ABNORMAL HIGH (ref 0.2–0.9)
Monocyte %: 6.8 %
NEUTROS ABS: 14.4 10*3/uL — AB (ref 1.4–6.5)
NEUTROS PCT: 81.1 %
Platelet: 293 10*3/uL (ref 150–440)
RBC: 3.57 10*6/uL — ABNORMAL LOW (ref 3.80–5.20)
RDW: 14.3 % (ref 11.5–14.5)
WBC: 17.8 10*3/uL — ABNORMAL HIGH (ref 3.6–11.0)

## 2014-12-22 LAB — URINALYSIS, COMPLETE
BILIRUBIN, UR: NEGATIVE
Hyaline Cast: 2
KETONE: NEGATIVE
Nitrite: NEGATIVE
PH: 7 (ref 4.5–8.0)
Protein: NEGATIVE
RBC,UR: 10 /HPF (ref 0–5)
Specific Gravity: 1.004 (ref 1.003–1.030)
WBC UR: 40 /HPF (ref 0–5)

## 2014-12-22 LAB — CLOSTRIDIUM DIFFICILE(ARMC)

## 2014-12-23 DIAGNOSIS — I361 Nonrheumatic tricuspid (valve) insufficiency: Secondary | ICD-10-CM

## 2014-12-23 LAB — CBC WITH DIFFERENTIAL/PLATELET
BASOS ABS: 0 10*3/uL (ref 0.0–0.1)
Basophil %: 0.2 %
Eosinophil #: 0.1 10*3/uL (ref 0.0–0.7)
Eosinophil %: 0.9 %
HCT: 27.8 % — ABNORMAL LOW (ref 35.0–47.0)
HGB: 9.1 g/dL — ABNORMAL LOW (ref 12.0–16.0)
LYMPHS ABS: 1.8 10*3/uL (ref 1.0–3.6)
Lymphocyte %: 11.2 %
MCH: 29.1 pg (ref 26.0–34.0)
MCHC: 32.5 g/dL (ref 32.0–36.0)
MCV: 89 fL (ref 80–100)
MONO ABS: 1.3 x10 3/mm — AB (ref 0.2–0.9)
Monocyte %: 8.4 %
NEUTROS PCT: 79.3 %
Neutrophil #: 12.7 10*3/uL — ABNORMAL HIGH (ref 1.4–6.5)
PLATELETS: 248 10*3/uL (ref 150–440)
RBC: 3.12 10*6/uL — ABNORMAL LOW (ref 3.80–5.20)
RDW: 13.9 % (ref 11.5–14.5)
WBC: 15.9 10*3/uL — ABNORMAL HIGH (ref 3.6–11.0)

## 2014-12-23 LAB — BASIC METABOLIC PANEL
Anion Gap: 8 (ref 7–16)
BUN: 14 mg/dL
Calcium, Total: 8.1 mg/dL — ABNORMAL LOW
Chloride: 104 mmol/L
Co2: 25 mmol/L
Creatinine: 0.73 mg/dL
EGFR (African American): >60
EGFR (Non-African Amer.): >60
Glucose: 86 mg/dL
Potassium: 3.5 mmol/L
Sodium: 136 mmol/L

## 2014-12-23 LAB — MAGNESIUM: Magnesium: 1.5 mg/dL — ABNORMAL LOW

## 2014-12-24 LAB — URINE CULTURE

## 2014-12-27 LAB — CULTURE, BLOOD (SINGLE)

## 2015-01-10 NOTE — Op Note (Signed)
PATIENT NAME:  Claire RaymondCRANFIELD, [name] MR#:  [id number] DATE OF BIRTH:  [birth year]  DATE OF PROCEDURE:  06/29/2012  PREOPERATIVE DIAGNOSIS:  Cataract, right eye.   POSTOPERATIVE DIAGNOSIS:  Cataract, right eye.  PROCEDURE PERFORMED:  Extracapsular cataract extraction using phacoemulsification with placement of an Alcon [id number], 21.0-diopter posterior chamber lens, serial #[id number].  SURGEON:  Maylon PeppersSteven A. Prosper Paff, MD  ASSISTANT:  None.  ANESTHESIA:  4% lidocaine and 0.75% Marcaine in a 50/50 mixture with 10 units/mL of Hylenex added, given as a peribulbar.   ANESTHESIOLOGIST:  Zena AmosWilliam Kephart, MD  COMPLICATIONS:  None.  ESTIMATED BLOOD LOSS:  Less than 1 ml.  DESCRIPTION OF PROCEDURE:  The patient was brought to the operating room and given a peribulbar block.  The patient was then prepped and draped in the usual fashion.  The vertical rectus muscles were imbricated using 5-0 silk sutures.  These sutures were then clamped to the sterile drapes as bridle sutures.  A limbal peritomy was performed extending two clock hours and hemostasis was obtained with cautery.  A partial thickness scleral groove was made at the surgical limbus and dissected anteriorly in a lamellar dissection using an Alcon crescent knife.  The anterior chamber was entered superonasally with a Superblade and through the lamellar dissection with a 2.6 mm keratome.  DisCoVisc was used to replace the aqueous and a continuous tear capsulorrhexis was carried out.  Hydrodissection and hydrodelineation were carried out with balanced salt and a 27 gauge canula.  The nucleus was rotated to confirm the effectiveness of the hydrodissection.  Phacoemulsification was carried out using a divide-and-conquer technique.  Total ultrasound time was 1 minute and 31 seconds with an average power of 15.5 percent with CDE of 23.58.  Irrigation/aspiration was used to remove the residual cortex.  DisCoVisc was used to inflate the capsule and the  internal incision was enlarged to 3 mm with the crescent knife.  The intraocular lens was folded and inserted into the capsular bag using the AcrySert delivery system. Irrigation/aspiration was used to remove the residual DisCoVisc.  Miostat was injected into the anterior chamber through the paracentesis track to inflate the anterior chamber and induce miosis.  The wound was checked for leaks and none were found. The conjunctiva was closed with cautery and the bridle sutures were removed.  Two drops of 0.3% Vigamox were placed on the eye.   An eye shield was placed on the eye.  The patient was discharged to the recovery room in good condition. ____________________________ Maylon PeppersSteven A. Margarite Vessel, MD sad:slb D: 06/29/2012 13:53:04 ET T: 06/29/2012 14:17:46 ET JOB#: [id number]  cc: Viviann SpareSteven A. Harleigh Civello, MD, <Dictator> Erline LevineSTEVEN A Fadi Menter MD ELECTRONICALLY SIGNED 07/06/2012 14:11

## 2015-01-13 NOTE — Consult Note (Signed)
Chief Complaint:  Subjective/Chief Complaint Patient with C. diff toxin negative with antigen positive. Reported to be a possible C. diff carrier. No further diarrhea since yesterday. No pain today. Some pain with PO patassium yesterday.   VITAL SIGNS/ANCILLARY NOTES: **Vital Signs.:   06-Dec-14 06:30  Vital Signs Type Routine  Temperature Temperature (F) 98.4  Celsius 36.8  Temperature Source oral  Pulse Pulse 93  Respirations Respirations 18  Systolic BP Systolic BP 124  Diastolic BP (mmHg) Diastolic BP (mmHg) 70  Mean BP 88  Pulse Ox % Pulse Ox % 95  Pulse Ox Activity Level  At rest  Oxygen Delivery Room Air/ 21 %   Brief Assessment:  GEN well developed, well nourished, no acute distress   Respiratory normal resp effort  no use of accessory muscles   Gastrointestinal details normal Soft  Nontender   Additional Physical Exam Alert and orientated times 3   Lab Results: Routine Micro:  05-Dec-14 07:48   Micro Text Report CLOSTRIDIUM DIFFICILE   C.DIFFICILE ANTIGEN       C.DIFFICILE GDH ANTIGEN : POSITIVE   C.DIFFICILE TOXIN A/B     C.DIFFICILE TOXINS A AND B : NEGATIVE   PCR FOR TOXIGENIC C.DIFF  PCR FOR TOXIGENIC C.DIFFICILE : POSITIVE   INTERPRETATION Positive for toxigenic C. difficile, active toxin production NOT detected.  Patient has toxigenic C. difficile organisms present in the bowel, but toxin was not detected.  The patient may be a carrier or the level of toxin in the sample was below the limit of detection. This information should be used in conjunction with the patient's clinical history when deciding on possible therapy.   ANTIBIOTIC                       Routine Chem:  05-Dec-14 06:19   Potassium, Serum  3.1 (Result(s) reported on 27 Aug 2013 at 06:44AM.)    07:48   Result Comment C.DIFF - NOTIFIED OF CRITICAL VALUE  - HP TO BILL SMITH @ 1113 ON 08/26/13  - READ-BACK PROCESS PERFORMED.  Result(s) reported on 27 Aug 2013 at 11:15AM.   Assessment/Plan:   Assessment/Plan:  Assessment C. Diff toxin negative with antigen positive.   Plan Patient reported to be a carrier vs. low level of toxin. Now on flagyl and having no diarrhea. Continue current treatment.   Electronic Signatures: Midge MiniumWohl, Sereniti Wan (MD)  (Signed 06-Dec-14 08:51)  Authored: Chief Complaint, VITAL SIGNS/ANCILLARY NOTES, Brief Assessment, Lab Results, Assessment/Plan   Last Updated: 06-Dec-14 08:51 by Midge MiniumWohl, Elige Shouse (MD)

## 2015-01-13 NOTE — Consult Note (Signed)
PATIENT NAME:  Claire Mccarthy, [name]:  [id number] DATE OF BIRTH:  1925-04-12  DATE OF CONSULTATION:  08/25/2013  REFERRING PHYSICIAN:  Dr. Adrian SaranSital Mody. CONSULTING PHYSICIAN:  Dow AdolphMatthew Rein, MD  REASON FOR THE CONSULT: Diarrhea, rectal bleeding, melena.   HISTORY OF PRESENT ILLNESS: Ms. Claire Mccarthy is an 81 year old female with a past medical history notable for CVA, hypertension, arthritis, hypothyroidism, presenting for evaluation of diarrhea, rectal bleeding.  Per the patient and her family, she developed diarrhea and rectal bleeding starting on Sunday. She describes several dark, nearly black bowel movements at this time. These symptoms then resolved, then approximately 24 hours ago, she again redeveloped diarrhea. This time, she had some bleeding, but it was more bright red at this time. She did have several episodes of diarrhea and bright red blood in her stool today. Her last episode was approximately 3 or 4 hours ago. She has not had any further black bowel movements since Sunday.   Of note, she did report an upper endoscopy about 2 years ago for abdominal pain. She reports her last colonoscopy was approximately 10 years ago. She has not had any trouble with bleeding prior to this. She does not take any NSAIDs or alcohol.    PAST MEDICAL HISTORY: 1.  Chronic hyponatremia.  2.  Hypothyroid.  3.  Osteopenia.  4.  Hyperlipidemia.  5.  CVA.  6.  Hypertension.  7.  Bell's palsy.  8.  Shingles.    9.  Arthritis.   PAST SURGICAL HISTORY:  1.  Hysterectomy.  2.  Hernia repair.   ALLERGIES: NKDA.   HOME MEDICATIONS:  1.  Zofran 4 mg b.i.d. 2.  Norvasc 5 mg t.i.d. 3.  Reglan 5 mg b.i.d. 4.  Meclizine 25 mg p.r.n.  5.  Lipitor 20 mg daily.  6.  Synthroid 50 mcg daily.  7.  Lasix 20 mg daily.  8.  Enalapril 10 mg b.i.d.  9.  Cyclobenzaprine 5 mg daily.  10.  Bupropion 75 mg daily.  11.  Bentyl 20 mg 4 times a day as needed.  12.  Aspirin 81.  13.  Ambien 5 mg at bedtime.  14.   Acyclovir 400 t.i.d.  15.  AcipHex 20 mg daily.   SOCIAL HISTORY: She denies any tobacco, alcohol or recreational drugs.   FAMILY HISTORY: No family history of GI malignancy that she is aware of.   REVIEW OF SYSTEMS:  A 10 system review was conducted. It is negative except as stated in the HPI.    PHYSICAL EXAMINATION: VITAL SIGNS: Temperature 97.2, pulse 94, respirations 18, blood pressure 119/71. Pulse oximetry is 95% on room air.  GENERAL:  Elderly-appearing female.  Appears stated age.  She is alert and oriented x 4.  No distress.   HEENT: Normocephalic/atraumatic. Extraocular movements are intact. Anicteric. NECK: Soft, supple. JVP appears normal. No adenopathy. CHEST: Clear to auscultation. No wheeze or crackle. Respirations unlabored. HEART: Regular. No murmur, rub, or gallop.  Normal S1 and S2. ABDOMEN:  Positive for mild tenderness in the left lower quadrant.  Normoactive bowel sounds. Soft.  No rebound or guarding.    EXTREMITIES: No swelling, well perfused.  SKIN: No rash or lesion. Skin color, texture, turgor normal. NEUROLOGICAL: Grossly intact. PSYCHIATRIC: Normal tone and affect. MUSCULOSKELETAL: No joint swelling or erythema.   LABORATORY DATA:  Her sodium 132, potassium 3.4, creatinine 0.48, BUN 14. Liver enzymes are normal. Cardiac enzymes are normal. White count 17, hemoglobin 11.6, hematocrit 34. INR is 1.0. Stool studies  are pending; no results yet there. CT scan was done, which shows  severe wall thickening of the descending and sigmoid colon consistent with an inflammatory or infectious colitis.   ASSESSMENT AND PLAN: 1.  Rectal bleeding, diarrhea: Based on the diarrhea, the bleeding and the CT findings of thickening in the distal colon, along with the left lower quadrant pain, this would be consistent with an acute infectious colitis. At this time, it will be important to rule out C. diff or another infectious etiology. This also would go along with a significantly  elevated white count at this time.   Would await stool studies and follow clinical course before deciding on necessity of a colonoscopy or an upper endoscopy. Empiric antibiotics at this time are okay as you are doing. If she continues to have rectal bleeding, especially with a drop in hemoglobin and no explanation is found for this on stool studies, may likely do flexible sigmoidoscopy or colonoscopy at that time.   2.  Questionable melena: It seems that before she developed the diarrhea and the rectal bleeding, she does describe these dark, tarry stools. She has not had a drop in her hemoglobin to this point. Would recommend a PPI at this time. If she does develop a drop in her hemoglobin or any further dark, tarry stools, then we would perform an upper endoscopy at that time. For now, we will follow her clinical course before deciding on the necessity for endoscopy. For now, she will continue on a clear diet. I will continue to follow with you. Also, please continue to monitor hemodynamics and hemoglobin closely.  Will continue to follow with you.     ____________________________ Dow Adolph, MD mr:dmm D: 08/25/2013 21:15:14 ET T: 08/25/2013 21:37:54 ET JOB#: [id number]  cc: Dow Adolph, MD, <Dictator> Kathalene Frames MD ELECTRONICALLY SIGNED 08/30/2013 9:20

## 2015-01-13 NOTE — Discharge Summary (Signed)
PATIENT NAME:  Claire Mccarthy, Claire Mccarthy MR#:  [id number] DATE OF BIRTH:  April 19, 1925  DATE OF ADMISSION:  08/25/2013 DATE OF DISCHARGE:  08/28/2013  PRESENTING COMPLAINT: Diarrhea.   DISCHARGE DIAGNOSES: 1.  Acute left-sided colitis.  2.  Clostridium difficile carrier, positive antigen, negative toxins.  3.  Hypertension.   CODE STATUS: Full code.   MEDICATIONS: 1.  Cyclobenzaprine 10 mg 1/2 to 1 tablet p.o. daily at bedtime as needed.  2.  Meclizine 25 mg 1/2 tablet 3 times a day as needed.  3.  Tylenol 325 mg 2 tablets every 4 hours as needed.  4.  Levothyroxine 50 mcg p.o. daily.  5.  Sodium chloride 1000 mg p.o. daily as before.  6.  Zofran 4 mg b.i.d.  7.  Bupropion 75 mg daily.  8.  Acetaminophen/hydrocodone 5/325, 1/2 tablet at bedtime as needed.  9.  Metoclopramide 5 mg b.i.d. p.r.n.  10.  Aspirin 81 mg daily.  11.  AcipHex 20 mg daily.  12.  Lipitor 20 mg daily.  13.  Enalapril 10 mg b.i.d.  14.  Calcium with vitamin D 1 tablet b.i.d.  15.  Norvasc 5 mg b.i.d.  16.  Ambien 5 mg 1/2 tablet to 1 tablet at bedtime as needed.  17.  Flagyl 500 mg p.o. t.i.d. for 6 more days.  18.  Probiotic 1 capsule 3 times a day for 10 days.  DISCHARGE INSTRUCTIONS:   1.  Home health PT. 2.  Follow up with Dr. Martha ClanKrasinski on your appointment December 17.  3.  Follow up with your primary care physician, Dr. Hillery AldoSarah Harith Mccadden.   CONSULTATIONS: GI consultation, Dr. Shelle Ironein.  LABORATORY DATA: C. difficile antigen positive. C. difficile toxin A and B negative. PCR for toxigenic C. difficile positive.   Stool comprehensive: Heavy growth of yeast. Stool comprehensive another sample: Negative for Shigella, Salmonella, and Campylobacter.   White count is 15.6, hemoglobin and hematocrit 10.3 and 30.0. Potassium is 4.   Abdominal CT shows small ventral hernia in the left lower quadrant of the abdomen, severe wall thickening of the descending and sigmoid colon consistent with inflammatory/infectious colitis.    Urinalysis: Negative for UTI. Blood cultures: Negative in 48 hours. Cardiac enzymes negative.   BRIEF SUMMARY OF HOSPITAL COURSE: Claire Mccarthy is an 81 year old Caucasian female who presented with 2-day history of diarrhea, cramping abdominal pain, bloody with dark-colored stools, and CT showing colitis. She was admitted with:  1.  Acute left-sided colitis: She was started on IV Flagyl and IV fluoroquinolones. The patient's aspirin was held, now resumed since her bloody diarrhea resolved. She was tested C. difficile antigen positive, but toxins were negative, and she is probably a carrier at this time. She will finish up a 10-day course of Flagyl. She was followed by Dr. Shelle Ironein during the hospital stay. Her white count has improved. She is able to tolerate regular diet prior to discharge.  2.  Orthostatic hypotension from diarrhea and dehydration: Resolved.  3.  History of hypertension: Her home meds were resumed.  4.  History of CVA: The patient's aspirin was resumed at discharge.  5.  Hypothyroidism: Continue Synthroid.  6.  Left knee arthritis: The patient will follow up with Dr. Martha ClanKrasinski on December 17 as her outpatient appointment.  7.  Generalized weakness and debility: Physical therapy saw patient and recommended home health PT, which has been set up. The patient also has been prescribed a front-wheeled and bedside commode.   Hospital stay otherwise remained stable. The patient remained  a full code.   TIME SPENT: 40 minutes.    ____________________________ Wylie Hail Allena Katz, MD sap:jcm D: 08/28/2013 13:42:14 ET T: 08/28/2013 17:07:20 ET JOB#: [id number]  cc: Vanna Shavers A. Allena Katz, MD, <Dictator> Kathreen Devoid, MD Sarah "Sallie" Allena Katz, MD Willow Ora MD ELECTRONICALLY SIGNED 09/03/2013 11:11

## 2015-01-13 NOTE — H&P (Signed)
PATIENT NAME:  Claire Mccarthy, Claire Mccarthy MR#:  [id number] DATE OF BIRTH:  04/21/1925  DATE OF ADMISSION:  08/25/2013  PRIMARY CARE PHYSICIAN: Sarah "Sallie" Posey Pronto, MD  PRIMARY GI PHYSICIAN:  Lollie Sails, MD  CHIEF COMPLAINT: Cramping abdominal pain, diarrhea and weakness since Sunday.   HISTORY OF PRESENT ILLNESS: This is a very pleasant 81 year old female with history of hypertension, CVA, hypothyroidism and peptic ulcer disease, who presents with the above complaints. Since Sunday, the patient has had cramping abdominal pain, nausea, vomiting, poor p.o. intake and diarrhea. Actually, over the last 24 hours, she has noticed dark brown to black-colored stools, also with some bloody stools this morning. She is having to go use the restroom every 15 to 20 minutes. She does have chills. No sick contacts. No recent travel history. No recent change in her diet.   REVIEW OF SYSTEMS:   CONSTITUTIONAL: No fever. Positive chills. Positive fatigue. Positive weakness.  EYES:  No double vision or blurred vision.    EARS, NOSE, THROAT: No ear pain, hearing loss, seasonal allergies, difficulty swallowing.  RESPIRATORY: No cough, wheezing, hemoptysis, COPD.   CARDIOVASCULAR:  No chest pain, orthopnea, palpitations, syncope, edema, arrhythmia, dyspnea on exertion. Positive presyncope.  GASTROINTESTINAL: Positive nausea, no vomiting. Positive diarrhea. Positive abdominal pain. Positive dark-colored stools. Positive history of peptic ulcer disease.  GENITOURINARY:  No dysuria or hematuria. Positive nocturia.  ENDOCRINE:  No polyuria or polydipsia. Positive thyroid problems.  HEMATOLOGY AND LYMPHATIC:  Positive anemia. Positive easy bruising.  SKIN: No rash or lesions.  MUSCULOSKELETAL: Positive limited activity due to arthritis in her knees.  NEUROLOGIC:  Positive history of CVA. No ataxia.  PSYCHIATRIC: No history of anxiety or depression.   PAST MEDICAL HISTORY: 1.  Chronic hyponatremia.  2.   Hypothyroidism.  3.  Osteopenia.  4.  Hyperlipidemia.  5.  History of CVA.   6.  History of hypertension.  7.  History of Bell's palsy.  8.  History of shingles.  9.  Arthritis.   PAST SURGICAL HISTORY: 1.  Total hysterectomy.  2.  Hernia repair.   ALLERGIES: No known drug allergies.   MEDICATIONS:  1.  Tylenol 325 two tablets q.4 hours p.r.n. pain.  2.  Zofran 4 mg b.i.d.  3.  Norvasc 5 mg t.i.d.  4.  MiraLax daily.  5.  Reglan 5 mg 1 tablet b.i.d.  6.  Meclizine 25 mg half tablet t.i.d. p.r.n. 7.  Lipitor 20 mg daily.  8.  Synthroid 50 mcg daily.  9.  Lasix 20 mg daily.  10.  Enalapril 10 mg b.i.d.  11.  Cyclobenzaprine 10 mg 1/2 tablet to 1 tablet daily.  12.  Calcium and vitamin D 1 tablet b.i.d.  13.  Bupropion 75 mg daily.  14.  Bentyl 20 mg 4 times a day.  15.  Aspirin 81 mg daily.  16.  Ambien 5 mg at bedtime p.r.n.  17.  Acyclovir 400 mg t.i.d. p.r.n.  18.  AcipHex 20 mg daily.   SOCIAL HISTORY:  No tobacco, alcohol or drug use.   PHYSICAL EXAMINATION: VITAL SIGNS: Temperature 98.3, pulse 83, respirations 18, blood pressure 142/53, 98% on room air. Orthostatics were checked, blood pressure 130/51 lying, sitting 123/57 and standing 105/58.  GENERAL: The patient is alert, oriented, in moderate distress just because she is very weak.  HEENT: Head is atraumatic. Pupils are round. Sclerae anicteric. Mucous membranes are dry. Oropharynx clear.   NECK: Supple without JVD, carotid bruit, enlarged thyroid.  CARDIOVASCULAR: Regular  rate and rhythm. No murmurs, gallops or rubs. PMI is not displaced.  LUNGS: Clear to auscultation without crackles, rales, rhonchi or wheezing. Normal chest expansion.  ABDOMEN: She has got some diffuse tenderness but no rebound, guarding. Hyperactive bowel sounds. No hepatosplenomegaly.  EXTREMITIES: No clubbing, cyanosis or edema.  NEUROLOGIC: Cranial nerves II through XII are grossly intact. There are no focal deficits. MUSCULOSKELETAL:  The  patient is able to move all extremities. No apparent pathology to digits and nails.  SKIN: Without rashes or lesions.   LABORATORY, RADIOLOGICAL AND DIAGNOSTIC DATA:  1.  Sodium 132, potassium 3.4, chloride 98, bicarb 24, BUN 14, creatinine 0.48, glucose 125, calcium 8.9, total protein 6.2, albumin 3.6, total bili 0.5, alk phos 50, ALT 29, AST 18.  2.  Troponin less than 0.02. CK 67, CPK-MB 1.4.  3.  White blood cells 17.2, hemoglobin 11.6, hematocrit 35, platelets 348.  4.  Urinalysis shows negative LCE and nitrites.  5.  EKG is sinus rhythm with PVCs, no ST elevations or depressions.  6.  CT scan of the abdomen and pelvis small ventral hernia in the left lower quadrant of the abdomen containing loops of small bowel without evidence of incarceration or obstruction, severe wall thickening of the descending and sigmoid colon consistent with inflammatory or infectious colitis.   ASSESSMENT AND PLAN: This is an 81 year old female who presents with a few days of diarrhea, cramping abdominal pain, bloody stools and dark-colored stools with a CT showing colitis.  1.  Colitis. The patient would be admitted on Flagyl and Levaquin. Will also obtain gastroenterology consultation due to her bloody stools, history of peptic ulcer disease and dark-colored stools. It should be noted that the patient does take an aspirin daily.  Stool cultures as well as Clostridium difficile cultures have been ordered. We will provide hydration as this patient is orthostatic due to her diarrhea. Further recommendations per gastroenterology.  She will be on a clear liquid diet.  2.  Orthostatic hypotension from diarrhea and severe dehydration. We will provide IV fluids, recheck orthostatics in the a.m. after hydration.  3.  History of hypertension. Will continue outpatient medications of Norvasc at this time.  4.  History of cerebrovascular accident. We will hold aspirin for now due to bloody stools that the patient had reported.   5.  Bloody stools and dark in color may be secondary to colitis but may also be related to some sort of peptic ulcer disease. Will have gastroenterology consultation, follow her hemoglobin and add Protonix 40 IV b.i.d.  6.  Hypothyroidism. We will continue Synthroid.  7.  Arthritis. The patient is complaining of some knee pain. She is due for an injection on Friday with Dr. Mack Guise so we will go ahead and consult Dr. Mack Guise while the patient is here in the hospital.  8.  The patient is full CODE STATUS.   TIME SPENT: Approximately 45 minutes.      ____________________________ Donell Beers. Benjie Karvonen, MD spm:cs D: 08/25/2013 15:04:40 ET T: 08/25/2013 15:27:03 ET JOB#: [id number]  cc: Ashly Yepez P. Benjie Karvonen, MD, <Dictator> Sarah "Sallie" Posey Pronto, MD Lollie Sails, MD Donell Beers Riot Waterworth MD ELECTRONICALLY SIGNED 08/25/2013 17:53

## 2015-01-13 NOTE — Consult Note (Signed)
GI follow up  Stool pos for c diff although toxin negative.   GIven her clinical presenation, this is c/w c.diff. She is improving on flagyl.  Stool starting to form up some today.   Recs: - flagyl 500 mg q8 hr x 14 days total - change to PO vanc if still having diarrhea after 5 days on flagyl - I will advance to regular diet.  - I suspect she is getting close to discharge.  No plans for colon or endo currently.   - GI follow up in 2 weeks.   Electronic Signatures: Dow Adolphein, Lotus Gover (MD)  (Signed on 05-Dec-14 18:24)  Authored  Last Updated: 05-Dec-14 18:24 by Dow Adolphein, Zaiya Annunziato (MD)

## 2015-01-14 NOTE — Consult Note (Signed)
Brief Consult Note: Diagnosis: diarrhea, BRBPR.   Patient was seen by consultant.   Comments: patient seen and examined. She was admitted earlier this morning with complaints of rectal bleeding and diarrhea. Symptoms began yesterday . She will first experience lower abdominal cramping followed by a bloody bowel movement- then the cramping alleviates. She reports mostly blood when she sits down, but there still has been small amounts of soft, loose stool. no recent travel or sick contacts. No recent abx use. Some nausea but no vomiting. no fever or chills.  She has a PMH significant for Cdiff in Dec 2014-she reports this feeling similar. Stool was sent for cdiff and was positive. WBC markedly high at 20.6. CT abdo/pelvis reveals inflammatory changes in the descending and rectosigmoid colon suggesting colitis. She was admitted and started on Vancomycin 500mg  QID. She is NPO and does not currently have an appetite. Continue Abx for now, CTM WBC, H&H.  Will decrease vancomycin to 250 QID. This could be cdiff, vs ischemia, vs diverticulitis.  Okay to advance to CL diet. Will check a serum electropheresis IgG.  Full consult being dictated.  All questions were answered..  Electronic Signatures: Brantley StageEarle, Karyn M (PA-C)  (Signed 26-Jun-15 12:44)  Authored: Brief Consult Note   Last Updated: 26-Jun-15 12:44 by Ashok CordiaEarle, Karyn M (PA-C)

## 2015-01-14 NOTE — Consult Note (Signed)
Pt reports no diarrhea, no abd pain, thinks she can go home tomorrow, on  a soft diet, VSS afebrile.  Recommend 14 day course of vancomycin.  Electronic Signatures: Scot JunElliott, Robert T (MD)  (Signed on 28-Jun-15 13:10)  Authored  Last Updated: 28-Jun-15 13:10 by Scot JunElliott, Robert T (MD)

## 2015-01-14 NOTE — Consult Note (Signed)
Pt with onset bleeding, diarrhea, some discomfort.  Stool positive for C. Diff., WBC 20K, previous C. diff in Dec 2014.  If has recurrent C. diff may need transplant but since this is first case in 7 months she may do OK with a course of vancomycin.  Electronic Signatures: Scot JunElliott, Charrise Lardner T (MD)  (Signed on 26-Jun-15 18:33)  Authored  Last Updated: 26-Jun-15 18:33 by Scot JunElliott, Marcoantonio Legault T (MD)

## 2015-01-14 NOTE — Discharge Summary (Signed)
PATIENT NAME:  [name], Claire Mccarthy MR#:  [id number] DATE OF BIRTH:  08-06-1925  DATE OF ADMISSION:  03/19/2014 DATE OF DISCHARGE:  03/21/2014  PRESENTING COMPLAINT: Diarrhea.   DISCHARGE DIAGNOSES:  1.  Recurrent Clostridium difficile colitis.  2.  Hypertension.   CONDITION ON DISCHARGE: Fair.   CODE STATUS: FULL CODE.   MEDICATIONS:  1.  Vancomycin 250 mg p.o. daily.  2.  Cyclobenzaprine 10 mg 0.12 one tablet at bedtime as needed.  3.  Meclizine 25 mg 1/2 tablet 3 times a day as needed.  4.  Tylenol 325 mg 2 tablets every 4 hours.  5.  Levothyroxine 50 mcg p.o. daily.  6.  Sodium chloride 1000 mg p.o. daily.  7.  Zofran 4 mg b.i.d. as needed.  8.  Bupropion 75 mg daily.  9.  Acetaminophen/hydrocodone 325/5 half tablet at bedtime as needed.  10.  Metoclopramide 5 mg b.i.d. as needed.  11.  Aspirin 81 mg daily.  12.  AcipHex 20 mg daily.  13.  Lipitor 20 mg daily.  14.  Enalapril 10 mg b.i.d.  15.  Calcium with vitamin D p.o. b.i.d.  16.  Norvasc 5 mg b.i.d.  17.  Lidoderm 5% topical film, apply to affected area daily.  18.  Oxybutynin 5 mg p.o. daily at bedtime.  19.  Bentyl 120 mg p.o. 4 times a day.  20.  Lasix 20 mg daily.  21.  MiraLax as needed.  22.  Trazodone 50 mg p.o. at bedtime.  23.  Vancomycin 250 mg p.o. every 6 hours.  DISCHARGE INSTRUCTIONS:   1.  Follow up with Dr. Marva PandaSkulskie  on your get appointment July 22.  2.  Yogurt twice a day.  3.  [facility] up with [facility], Dr. Hillery AldoSarah Jomes Giraldo.  GASTROINTESTINAL CONSULTATION: Dr. Mechele CollinElliott.  CBC: White count was 9.9. Hemoglobin and hematocrit was 9.7 and 27.9.    ____________________________ Jearl KlinefelterSona A. Allena KatzPatel, MD sap:ts D: 03/23/2014 11:16:34 ET T: 03/23/2014 12:13:38 ET JOB#: [id number]  cc: Eunice Oldaker A. Allena KatzPatel, MD, <Dictator> Christena DeemMartin U. Skulskie, MD Sarah "Sallie" Allena KatzPatel, MD Willow OraSONA A Kathrynn Backstrom MD ELECTRONICALLY SIGNED 03/26/2014 10:32

## 2015-01-14 NOTE — H&P (Signed)
PATIENT NAME:  Claire Mccarthy, Claire Mccarthy MR#:  [id number] DATE OF BIRTH:  April 25, 1925  DATE OF ADMISSION:  03/18/2014  REFERRING PHYSICIAN: Dr. Marjean Donna.  PRIMARY GASTROENTEROLOGIST: Dr. Loistine Simas   PRIMARY CARE PHYSICIAN: Dr. Denton Lank  CHIEF COMPLAINT: Cramping abdominal pain, diarrhea, bright red blood per rectum, nausea.   HISTORY OF PRESENT ILLNESS: This is an 81 year old female who presents with above-mentioned complaints. The patient was admitted in December of last year due to C. diff colitis.  The patient reports these complaints represent her previous episode of C. diff infection. The patient reports yesterday afternoon she started to have cramping abdominal pain, bright red blood per rectum, nausea. Denies any vomiting. Had multiple episodes of diarrhea. The patient reports some chills, but she was afebrile here. Upon presentation, the patient had basic work-up done, which did show significant leukocytosis at 20,000. The patient had CT abdomen and pelvis which did show evidence of colitis in the rectosigmoid area which is typical for C. diff colitis. The patient was started on IV Flagyl and p.o. vancomycin in the ED. Stool cultures were sent and they are still pending. The patient denies any shortness of breath, any chest pain, any coffee-ground emesis.   PAST MEDICAL HISTORY: 1.  Chronic hyponatremia.  2.  Hypothyroidism.  3.  Osteopenia.  4.  Hyperlipidemia.  5.  CVA.  6.  Hypertension.  7.  Bell palsy.  8.  Shingles. 9.  Arthritis.  10.  C. diff in the past.   PAST SURGICAL HISTORY:  1.  Total hysterectomy.  2.  Hernia repair.   ALLERGIES: No known drug allergies.   REVIEW OF SYSTEMS: CONSTITUTIONAL: The patient denies any fever. Reports chills, fatigue, weakness.  EYES: Denies blurry vision, double vision, inflammation.  ENT: Denies tinnitus, ear pain, hearing loss or dysphagia.  RESPIRATORY: Denies cough, wheezing, hemoptysis, COPD. CARDIOVASCULAR: Denies chest  pain, orthopnea, palpitations, syncope.  GASTROINTESTINAL: Reports GI cramping abdominal pain, diarrhea, and bright red blood per rectum. Denies any vomiting, coffee-ground emesis.  GENITOURINARY: Denies dysuria, hematuria, renal colic.  ENDOCRINE: Denies polyuria, polydipsia, heat or cold intolerance.  HEMATOLOGY: Denies anemia, easy bruising, bleeding diathesis.  INTEGUMENT: Denies acne, rash, or skin lesion.  MUSCULOSKELETAL: Reports history of arthritis. Denies any cramps.  NEUROLOGIC: Reports history of CVA. Denies any memory problems, any vertigo or ataxia.  PSYCHIATRIC: No anxiety or depression.   ALLERGIES: No known drug allergies.   HOME MEDICATIONS: 1.  Aspirin 81 mg oral daily.  2.  Tylenol as needed.  3.  Tylenol/hydrocodone 325/5 mg 1/2 tablet at bedtime as needed for knee pain.  4.  Enalapril 10 mg or 2 times a day.  5.  Trazodone 50 mg oral at bedtime.  6.  Meclizine 25 mg 1/2 tablet 3 times a day as needed.  7.  Metoclopramide 5 mg oral 2 times a day for gastroparesis.  8.  Zofran 4 mg 2 times a day as needed.  9.  Lipitor 20 mg oral in the evening.  10.  Norvasc 5 mg 2 times a day.  12.  Bentyl 20 mg 4 times a day as needed.  13.  MiraLax as needed.  14.  Sodium chloride 1000 mg oral daily.  15.  Bupropion 75 mg oral daily.  16.  AcipHex 20 mg oral daily.  17.  Cyclobenzaprine 10 mg oral 1/2 tablet to 1 tablet at bedtime as needed.  18.  Levothyroxine 50 mcg oral daily.  19.  Oxybutynin extended release 5 mg daily.  20.  Calcium with vitamin D 1 tablet 2 times a day.   PHYSICAL EXAMINATION: VITAL SIGNS: Temperature 97.6, pulse 77, respiratory rate 17, blood pressure 147/66, saturating 96% on room air.  GENERAL: Elderly female, lies comfortable in bed, in no apparent distress.  HEENT: Head atraumatic, normocephalic. Pupils equal and reactive to light. Pink conjunctivae. Anicteric sclerae. Dry oral mucosa.  NECK: Supple. No thyromegaly. No JVD.  CHEST: Good air  entry bilaterally. No wheezing, rales or rhonchi.  CARDIOVASCULAR: S1 and S2 heard. No rubs, murmurs or gallops.  ABDOMEN: Soft. Mild tenderness to palpation. No rebound. No guarding. Bowel sounds present. No organomegaly could be appreciated.  EXTREMITIES: No edema. No clubbing. No cyanosis. Pedal and radial pulses felt bilaterally.  PSYCHIATRIC: Appropriate affect. Awake and alert x3. Intact judgment and insight.  NEUROLOGIC: Cranial nerves grossly intact. Motor 5/5. No focal deficits.  SKIN: Normal skin turgor. Warm and dry.  MUSCULOSKELETAL: No joint effusion or erythema.   DIAGNOSTIC DATA: Pertinent labs: Glucose 170, BUN 18, creatinine 0.57, sodium 131, potassium 3.8, chloride 98, CO2 24. Troponin less than 0.02. ALT 22, AST 17, alk phos 59. White blood cell 20.6, hemoglobin 11.7, hematocrit 34.9, platelets 399,000. Urinalysis negative for leukocyte esterase and nitrites.   Imaging: CT abdomen and pelvis with contrast showing persisting inflammatory changes in the ascending and rectosigmoid colon, likely presenting colitis and consistent with C. diff colitis. There is hernia containing small bowl without proximal obstruction, unchanged from previous study.   ASSESSMENT AND PLAN:  1.  Abdominal pain, nausea and diarrhea. This is most likely Clostridium difficile colitis as this  resembles previous episode of Clostridium difficile colitis. The patient will be started on p.o. vancomycin. Will be started on probiotics as well. Will be kept on aggressive IV fluid hydration. Clostridium difficile was sent. The results are still pending. The patient will be resumed on her diet as long as she does not have vomiting.  2.  Bright red blood per rectum. This is most likely related to her colitis. We will check hemoglobin every 8 hours. Will consult gastroenterology. Will hold her aspirin. Will hold chemical anticoagulation.  3.  Leukocytosis. This is most likely related to Clostridium difficile.  4.   Chronic hyponatremia. Continue with IV normal saline and sodium chloride tablets.  5.  Hypothyroidism. Continue with Synthroid.  6.  Hyperlipidemia. Continue with statin.  7.  History of cerebrovascular accident. Continue with aspirin when the patient is more stable.   8.  History of hypertension. Blood pressure acceptable. Continue with home meds.  9.  Deep vein thrombosis prophylaxis. Sequential compression device. Resume on chemical anticoagulation when bright red blood per rectum resolves.  CODE STATUS: Discussed with the patient. She is a FULL code.   TOTAL TIME SPENT ON ADMISSION AND PATIENT CARE: 55 minutes. ____________________________ Albertine Patricia, MD dse:sb D: 03/18/2014 07:58:52 ET T: 03/18/2014 08:52:02 ET JOB#: [id number]  cc: Albertine Patricia, MD, <Dictator> DAWOOD Graciela Husbands MD ELECTRONICALLY SIGNED 03/18/2014 20:36

## 2015-01-14 NOTE — Consult Note (Signed)
PATIENT NAME:  [name], Aime G MR#:  [id number] DATE OF BIRTH:  1924/12/04  DATE OF CONSULTATION:  03/18/2014  REFERRING PHYSICIAN:  Huey Bienenstockawood Elgergawy, MD CONSULTING PHYSICIAN:  Lynnae Prudeobert Elliott, MD / Hardie ShackletonKaryn M. Earle, PA-C  REASON FOR CONSULTATION: Bright red blood per rectum.   HISTORY OF PRESENT ILLNESS: This is a pleasant 81 year old female who initially presented to the hospital earlier this morning with concerns of cramping abdominal pain, diarrhea and bright red blood per rectum. There is also some mild nausea. She has a past medical history significant for C. diff diagnosed in December of 2014 and does explain that her current symptoms feel very similar to that prior episode. Upon further work-up, she did have leukocytosis with a white blood cell count of 20.6, and her CT scan did reveal inflammation involving the descending and rectosigmoid colon. C. diff did return positive. She was admitted on vancomycin 500 mg 4 times daily and was given a single dose of Flagyl in the ER. She has been n.p.o. and states that she still does not have much of an appetite. The lower cramping abdominal pain is only present just prior to her moving her bowels and after she passes stool she does admit the abdominal pain improves. There have been no fever or chills. No recent travel or sick contacts. No recent antibiotic use. No unintentional weight changes. No chest pain or shortness of breath.   PAST MEDICAL HISTORY: Recent C. diff colitis in December of 2014, hyperlipidemia, hypertension, CVA, hypothyroidism, osteopenia, Bell palsy, shingles, osteoarthritis.   PAST SURGICAL HISTORY: Total hysterectomy and a hernia repair.   ALLERGIES: No known drug allergies.   HOME MEDICATIONS: Tylenol, aspirin, Vicodin, enalapril, trazodone, meclizine, metoclopramide, Lipitor, Zofran, Norvasc MiraLax, Bentyl, bupropion, AcipHex, cyclobenzaprine, oxybutynin, levothyroxine, calcium with vitamin D.   FAMILY HISTORY: There is no  known family history of GI malignancy, colon polyps or IBD.   REVIEW OF SYSTEMS: A 10 system review of systems was obtained on the patient. Pertinent positives are mentioned above and otherwise negative.   PHYSICAL EXAMINATION: VITAL SIGNS: Blood pressure 144/65, heart rate 86, respirations 20, temperature 97.8, bedside pulse ox 96%.  GENERAL: This is a pleasant 81 year old female resting quietly and comfortably in bed in no acute distress, alert and oriented x3.  HEAD: Atraumatic, normocephalic.  NECK: Supple. No lymphadenopathy noted.  HEENT: Sclerae anicteric. Mucous membranes moist.  LUNGS: Respirations are even and unlabored. Clear to auscultation in bilateral anterior lung fields.  CARDIAC: Regular rate and rhythm. S1, S2 noted.  ABDOMEN: Soft. Very mildly tender to palpation in bilateral lower quadrants without guarding or rebound. No signs of an acute abdomen. Normoactive bowel sounds noted in all 4 quadrants. No masses, hernias or organomegaly appreciated.  RECTAL: Deferred.  PSYCHIATRIC: Appropriate mood and affect.  NEUROLOGIC: Cranial nerves II through XII are grossly intact.  EXTREMITIES: Negative for lower extremity edema, 2+ pulses noted in bilateral upper extremity.   DIAGNOSTIC DATA: Laboratory: White blood cells 20.6, hemoglobin 10.9, hematocrit 34.9, platelets 399,000, MCV 93. Troponins were negative. Sodium 131, potassium 3.8, BUN 18, creatinine 0.57, glucose 170. LFTs are within normal limits. Stool for C. diff was positive.   Imaging: A CT scan of the abdomen and pelvis was obtained on the patient showing inflammatory changes in the descending and rectosigmoid colon likely representing colitis consistent with possibly C. diff. Appendix was normal. Mild diffuse fatty infiltration of the liver with noted.   ASSESSMENT: 1.  Bright red blood per rectum.  2.  Diarrhea.  3.  Bilateral lower quadrant abdominal cramping.  4.  Clostridium difficile colitis.  5.  Leukocytosis.    PLAN: I have discussed this patient's case in detail with Dr. Lynnae Prude who is involved in the development of the patient's plan of care. At this time, the overall clinical picture is suggestive of Clostridium difficile, though this could also be some presentation of ischemia versus diverticulitis. We will decrease her dose of vancomycin from 500 to 250 mg 4 times a day. We do recommend keeping a close eye on her hemoglobin and hematocrit and being prepared to transfuse as necessary. We will advance her diet to clear liquids. We will also check a serum IgG level. Continue symptomatic management with antiemetics and pain control as needed. We will continue to monitor this patient throughout hospitalization and make further recommendations pending above and per clinical course.   Thank you so much for this consultation and for allowing [place] to participate in the patient's plan of care.   This services provided by Hardie Shackleton. Earle, PA-C under collaborative agreement with Dr. Lynnae Prude.   ____________________________ Hardie Shackleton. Earle, PA-C kme:sb D: 03/18/2014 13:29:06 ET T: 03/18/2014 14:01:38 ET JOB#: [id number]  cc: Hardie Shackleton. Earle, PA-C, <Dictator> Hardie Shackleton EARLE PA ELECTRONICALLY SIGNED 03/18/2014 14:36

## 2015-01-14 NOTE — Consult Note (Signed)
No bowel movement, walked a little with assistance, need to get up in chair morre. VSS afeb, hgb 9.7, plt 304, creat 0.58, May be able to go home Monday if no further GI problems.  I can follow her for the C, diff after discharge. Pt lives by herself and is very anxious about going home before she is able to care for herself at her age.  Maybe intensive home health care can be arranged???  Electronic Signatures: Scot JunElliott, Robert T (MD)  (Signed on 27-Jun-15 11:18)  Authored  Last Updated: 27-Jun-15 11:18 by Scot JunElliott, Robert T (MD)

## 2015-01-21 DIAGNOSIS — E785 Hyperlipidemia, unspecified: Secondary | ICD-10-CM

## 2015-01-21 DIAGNOSIS — I10 Essential (primary) hypertension: Secondary | ICD-10-CM | POA: Diagnosis present

## 2015-01-21 DIAGNOSIS — Z8673 Personal history of transient ischemic attack (TIA), and cerebral infarction without residual deficits: Secondary | ICD-10-CM

## 2015-01-21 HISTORY — DX: Hyperlipidemia, unspecified: E78.5

## 2015-01-21 HISTORY — DX: Personal history of transient ischemic attack (TIA), and cerebral infarction without residual deficits: Z86.73

## 2015-01-22 DIAGNOSIS — E039 Hypothyroidism, unspecified: Secondary | ICD-10-CM

## 2015-01-22 HISTORY — DX: Hypothyroidism, unspecified: E03.9

## 2015-01-22 NOTE — Consult Note (Signed)
PATIENT NAME:  Claire Mccarthy, Secilia G MR#:  [id number] DATE OF BIRTH:  [birth year]  DATE OF CONSULTATION:  12/22/2014  CONSULTING PHYSICIAN:  Pauletta BrownsYuriy Dempsey Ahonen, MD  REASON FOR CONSULTATION:  Syncope.   HISTORY OF PRESENT ILLNESS:  This is an 81 year old Caucasian female who lives alone with home health care assistant. She has been complaining of watery, persistent diarrhea for at least a day, admitted with 3 syncopal events, suspected to be orthostatic. Blood pressure is 80/30. On the third syncopal event, she was suspected to have generalized shaking activity. No tongue biting. No urinary incontinence. The patient was also found to have a urinary tract infection and was started on Rocephin.   PAST MEDICAL HISTORY:  Hypertension, hyperlipidemia, old strokes.   PAST SURGICAL HISTORY:  Total hysterectomy, hernia repair.   ALLERGIES:  No known drug allergies.   SOCIAL HISTORY:  Lives at home with nursing assistant.   REVIEW OF SYSTEMS:  Denies any fever. Denies any fatigue. No shortness of breath. No chest pain. No new weakness on one side of the body compared to the other. Positive syncope. No anxiety. No depression.   HOME MEDICATIONS:  Have been reviewed.   LABORATORY DATA:  Workup reviewed with elevated white blood cell count of 17.8.   IMAGING:  CAT scan of the head was done. It shows chronic changes. No acute intracranial pathology.   NEUROLOGICAL EXAMINATION:  The patient is awake and alert to time and place. Extraocular movements are intact. Facial sensation is intact. Facial motor is intact. Tongue is midline. Uvula elevates symmetrically. Shoulder shrug is intact. Motor is 4+/5 bilaterally in the upper and lower extremities. Sensation is intact to light touch and temperature. Coordination:  Finger-to-nose is intact. Gait is not assessed.   IMPRESSION:  This is an 81 year old female presenting with a syncopal episode with positive diarrhea, likely related to orthostasis, questionable seizure  activity. No past medical history of seizures. I suspect this is likely convulsive syncope. The patient has no past medical history of epilepsy. No family history of epilepsy. No acute intracranial abnormality on CAT scan.   PLAN:  Agree with Clostridium difficile screening and hydration. No antiepileptic. I do not think the patient needs an EEG at this point. I do think this is convulsive syncope and would not treat for seizures at this point. Please call if you have questions.   Thank you. It was a pleasure seeing this patient.    ____________________________ Pauletta BrownsYuriy Oleda Borski, MD yz:nb D: 12/22/2014 19:51:56 ET T: 12/23/2014 01:17:55 ET JOB#: [id number]  cc: Pauletta BrownsYuriy Charlisa Cham, MD, <Dictator> Pauletta BrownsYURIY Carlon Davidson MD ELECTRONICALLY SIGNED 12/27/2014 15:49

## 2015-01-22 NOTE — H&P (Signed)
PATIENT NAME:  Claire Mccarthy, Claire Mccarthy MR#:  [id number] DATE OF BIRTH:  27-Oct-1924  DATE OF ADMISSION:  12/22/2014  PRIMARY CARE PHYSICIAN: Dr. Hillery Aldo   REFERRING EMERGENCY ROOM PHYSICIAN: Dr. Shaune Pollack   CHIEF COMPLAINT: Syncope and seizures, new onset.   HISTORY OF PRESENT ILLNESS: The patient is an 81 year old Caucasian female who lives alone with a home health care assist, has been having watery diarrhea that started around the afternoon. Denies any nausea or vomiting. She had multiple episodes of watery diarrhea and started feeling dizzy. Following that she syncopized once EMS was called, but the patient refused to come to the ED. She had another 2 episodes of syncope and finally decided to come to the ED. She was complaining of dizziness prior to the syncopal episodes. After the third episode of syncope, the patient also started having seizure-like activity as reported by the home health care nurse. Initial blood pressure was 80/30. Eventually she was brought into the ED. A CT head was done, which is pending at this time. Orthostatic blood pressures have revealed a systolic blood pressure 127 with heart rate 96 in supine posture and standing posture. Since then her blood pressure dropped down to 116 and heart rate was up to 110. The patient could not recall any of her syncopal episodes. Two daughters are at bedside. Her urinalysis was abnormal. The patient was given IV Rocephin in the ED. During my examination, the patient is resting comfortably. Denies any chest pain, shortness of breath. She has a past medical history of stroke but denies any residual deficits. According to the daughter, the patient usually walks with the help of a cane and sometimes she tends to forget. Recently, she had a wrist fracture approximately 4 weeks ago and the cast was changed yesterday. No other complaints.   PAST MEDICAL HISTORY: A history of hypertension, hyperlipidemia, old history of stroke with no deficits, chronic  hyponatremia, hypothyroidism, osteopenia, hyperlipidemia, stroke, Bell's palsy, shingles, arthritis, Clostridium difficile in the past.   PAST SURGICAL HISTORY: Total hysterectomy, hernia repair.   ALLERGIES: No known drug allergies.   PSYCHOSOCIAL HISTORY: Lives at home, lives by herself. A home health nurse  comes in to assist her.   FAMILY HISTORY: Hypertension runs in her family.   REVIEW OF SYSTEMS:  CONSTITUTIONAL: Denies any fever, fatigue, weakness.  EYES: Denies blurry vision, double vision.  EARS, NOSE, AND THROAT: Denies epistaxis, discharge.  RESPIRATION: Denies cough, COPD.  CARDIOVASCULAR: No chest pain, palpitations. Had 3 episodes of syncope today afternoon.  GASTROINTESTINAL: Complaining of diarrhea, which is watery in nature. No abdominal pain. No nausea, vomiting, but complaining of abdominal cramps. No hematemesis, melena or ulcers.  GENITOURINARY: No dysuria or hematuria.  ENDOCRINE: Denies polyuria, nocturia. Has hypothyroidism.  HEMATOLOGIC AND LYMPHATIC: No anemia, easy bruising, bleeding.  INTEGUMENTARY: No acne, rash, lesions.  MUSCULOSKELETAL: Has osteoarthritis, but otherwise no back pain, gout.  NEUROLOGIC: Has old history of stroke with no deficits. No weakness, dysarthria.  PSYCHIATRIC: No ADD or OCD. Probably dementia.   HOME MEDICATIONS: Tylenol 325 mg 2 tabs p.o. every 4 hours, trazodone 50 mg 1 tablet p.o. once daily, sodium chloride tablets 1 tablet p.o. once daily, oxybutynin 5 mg 1 tablet p.o. once a day, Zofran 4 mg 1 tablet p.o. 2 times a day, Norvasc 5 mg 1 tablet p.o. 2 times a day, metoclopramide 5 mg 1 tablet p.o. 2 times a day, meclizine 25 mg 1/2 tablet p.o. 3 times a day, Lipitor 20 mg 1 tablet  p.o. once daily, Lidoderm 5% topical film apply topically to affected area, levothyroxine 50 mcg 1 tablet p.o. once daily, furosemide 20 mg p.o. once daily, enalapril 10 mg p.o. 2 times a day, calcium with vitamin D 1 tablet p.o. 2 times a day, bupropion  75 mg 1 tablet p.o. once daily, Bentyl 20 mg 4 times a day,  aspirin 81 mg p.o. once daily, AcipHex 20 mg once daily, Tylenol half tablet p.o. once a day.   PHYSICAL EXAMINATION:  GENERAL APPEARANCE: Not in acute distress. Moderately built and thin-looking female. HEENT: Normocephalic, atraumatic. Pupils are equally reacting to light and accommodation. No scleral icterus. No conjunctival injection. No sinus tenderness. No postnasal drip. Moist mucous membranes.  NECK: Supple. No JVD. No thyromegaly. Range of motion is intact.  LUNGS: Clear to auscultation bilaterally. No accessory muscle use and no anterior chest wall tenderness on palpation.  CARDIAC: S1, S2 normal. Regular rate and rhythm. No murmurs.  GASTROINTESTINAL: Soft. Bowel sounds are positive in all 4 quadrants. Nontender, nondistended. No masses.  NEUROLOGIC: Awake, alert, oriented x 3. Cranial nerves II through XII are grossly intact. Following verbal commands. Motor and sensory are intact except the upper extremity with a wrist fracture. Reflexes are 2+. SKIN: Warm to touch. Normal turgor. No rashes. No lesions.  PSYCHIATRIC: Normal mood and affect.   LABORATORY AND IMAGING STUDIES: CT head is pending. WBC 17.8, hemoglobin 10.4, hematocrit 32.3, platelets are 293,000. Stool for Clostridium difficile is negative. Urinalysis: Yellow in color, clear in appearance, glucose 50 mg/dL, nitrites negative, leukocyte esterase 3+. Clostridium difficile toxin is negative. LFTs: Total protein 5.4, albumin 3. The rest of the LFTs are normal. BMP: Glucose 168, BUN and creatinine are normal. Sodium 130, potassium 3.4, chloride 101, CO2 of 21. GFR greater than 60. Anion gap is 8. Serum calcium is 8.2. A 12 lead EKG: Sinus rhythm with premature atrial complexes at 84 beats per minute, normal PR interval, no QT prolongation. No acute ST-T wave changes.   ASSESSMENT AND PLAN: An 81 year old female brought into the ED after she had several episodes of watery  diarrhea starting from afternoon and had 3 syncopal episodes associated with dizziness and one episode of new onset seizure. Will be admitted with the following assessment and plan:  1.  Syncope 3 times, probably orthostatic from profuse watery diarrhea that started today. The patient has leukocytosis. Stool for Clostridium difficile is negative. We will admit her to the medical/surgical floor. I will provide her gentle hydration with intravenous fluids. Hold blood pressure medications enalapril and furosemide. We will get neurologic checks and repeat orthostatics. CT head is ordered, which is pending. We will also get echocardiogram to evaluate her left ventricular ejection fraction. If necessary, a rounding physician can order carotid Dopplers at their discretion.  2.  Seizures, new onset. Etiology is unclear. We will provide her Ativan on an as-needed basis and if the patient seizes again, will consider loading her up with Dilantin. Ordered EEG and a neurology consult. Will get neurologic checks q. 4 hours for the next 24 hours.  3.  Acute cystitis. The patient was given IV Rocephin after obtaining cultures. We will follow up on the urine culture and continue Rocephin. We will provide gentle hydration with intravenous fluids.  4.  Hypertension. Resume her home medication except enalapril and furosemide, which will be on hold.  5.  Hyperlipidemia. Continue statin.  6.  History of an old stroke with no deficits. Will continue aspirin and statin. Today CT  head is pending.  7.  Chronic history of dementia. The patient is answering all questions appropriately during my examination.  8.  Chronic hyponatremia. We do not know her baseline, today it is at 130. Will provide IV fluids as this is presumed to be from dehydration.  9.  Hypothyroidism. Continue Synthroid.  10.   Glucosuria. Will get hemoglobin A1c to rule out diabetes mellitus.   We will provide GI and DVT prophylaxis. She is full code. Daughters  are the medical power of attorney. The plan of care discussed in detail with the patient and her 2 daughters at bedside. They all verbalized an understanding.   TOTAL TIME SPENT ON THE ADMISSION: 50 minutes.    ____________________________ Ramonita LabAruna Taiesha Bovard, MD ag:at D: 12/22/2014 16:51:55 ET T: 12/22/2014 17:30:56 ET JOB#: [id number]  cc: Ramonita LabAruna Arianna Delsanto, MD, <Dictator> Ramonita LabARUNA Benecio Kluger MD ELECTRONICALLY SIGNED 01/06/2015 12:41

## 2015-01-22 NOTE — Discharge Summary (Signed)
PATIENT NAME:  Claire Mccarthy, Claire Mccarthy MR#:  [id number] DATE OF BIRTH:  Nov 15, 1924  DATE OF ADMISSION:  12/22/2014 DATE OF DISCHARGE:  12/24/2014  ADMISSION DIAGNOSIS: Possible seizure.   DISCHARGE DIAGNOSES: 1. Convulsive syncope.  2. Urinary tract infection.  3. Hypotension.  4. History of hypertension.  5. History of cerebrovascular accident.   CONSULTATIONS: Neurology.   EEG Was negative for epilepsy.   A 2-D echocardiogram showed normal ejection fraction. No valvular abnormalities.   DISCHARGE LABORATORIES: Discharge: White blood cells 15.9, hemoglobin 9, hematocrit 28, platelets are 248,000. Sodium 136, potassium 3.5, chloride 104, bicarbonate 25, BUN 14, creatinine 0.73, glucose 86. Urine culture: Two small colonies. Blood culture: Negative to date. Escherichia coli, 80,000, pan sensitive.   DISCHARGE PHYSICAL EXAMINATION: VITAL SIGNS: Temperature 98.8, pulse is 81, respirations 17, blood pressure 112/54, 93 on room air.  GENERAL: The patient is alert, oriented, not in acute distress.  HEENT: The patient has left  lymphadenopathy, which was tender. Oropharynx clear. There is no evidence of exudates on her tonsils or dental abnormalities.  CARDIOVASCULAR: Regular rate and rhythm. No murmurs, gallops, or rubs. PMI is not displaced.   ABDOMEN: Bowel sounds present. Nontender, nondistended,  no hepatosplenomegaly.  LUNGS: Clear to auscultation without crackles, rales, rhonchi, wheezing.  HOSPITAL COURSE: This is a very pleasant 81 year old female who was admitted on 12/22/2014 with possible syncope and seizures. For further details, please refer to the H and P.  1. Convulsive syncope. The patient was ruled out for seizure. Neurology saw the patient in consultation. An EEG was performed, which was negative. Neurology felt this was more likely convulsive syncope secondary to her hypotension. No need for antiepileptic. Keppra was initially started in the Emergency Department, but as mentioned,  neurology did not recommend anti-epileptics, so this was discontinued. Her CT showed no acute findings. The EEG was negative.  2. Hypotension from dehydration or urinary tract infection. Her blood pressure has improved. We have discontinued her outpatient medications for now, and she will need follow-up for her blood pressure as an outpatient.  3. Urinary tract infection. Urine culture is growing out Escherichia coli, and she was discharged with Keflex.  4. Left node lymphangitis. The patient does have a swollen tender lymph node, no source of infection. Keflex was prescribed at discharge.   DISCHARGE MEDICATIONS: 1. Keflex 500 mg t.i.d. x 10 days.  2. Trazodone 50 mg at bedtime.  3. MiraLax.  4. Bentyl 20 mg 4 times a day as needed.  5. Oxybutynin 5 mg per 24 hours at bedtime.  6. Lidoderm topical film applied to affected area daily.  7. Calcium, vitamin D b.i.d.  8. Lipitor 20 mg at bedtime.  9. AcipHex 20 mg daily.  10. Aspirin 81 mg daily.  11. Reglan 5 mg b.i.d. for gastroparesis.  12. Acetaminophen/hydrocodone 5/325, 1/2 tablet at bedtime p.r.n.  13. Bupropion 75 mg at bedtime.   14. Zofran 4 mg b.i.d. p.r.n.  15. Sodium chloride 1000 mg daily for low sodium.  16. Synthroid 50 mcg daily.  17. Tylenol 325 two tablets q. 4 hours p.r.n.  18. Meclizine 25 mg 1/2 tablet t.i.d. p.r.n. dizziness.   DISCHARGE DIET: Low sodium.   DISCHARGE ACTIVITY: As tolerated.   DISCHARGE FOLLOWUP: The patient will need to follow up with Dr. Hillery AldoSarah Mccarthy in 1 week for her blood pressure. The patient was stable for discharge.   TIME SPENT: Approximately 40 minutes     ____________________________ Claire Mccarthy P. Juliene PinaMody, MD spm:mw D: 12/24/2014 14:00:00 ET  T: 12/24/2014 15:39:55 ET JOB#: [id number]  cc: Claire Mccarthy P. Juliene Pina, MD, <Dictator> Claire "Maryruth Hancock, MD Claire Contes [name] MD ELECTRONICALLY SIGNED 12/24/2014 20:49

## 2016-05-20 ENCOUNTER — Telehealth: Payer: Self-pay

## 2016-05-20 NOTE — Telephone Encounter (Signed)
Patient called and left vm to reschedule her new patient appt. I called her back and r/s to Oct. 5

## 2016-05-21 ENCOUNTER — Ambulatory Visit: Payer: BLUE CROSS/BLUE SHIELD | Admitting: Pain Medicine

## 2016-06-27 ENCOUNTER — Ambulatory Visit: Payer: BLUE CROSS/BLUE SHIELD | Admitting: Pain Medicine

## 2017-05-05 ENCOUNTER — Encounter: Payer: Self-pay | Admitting: Podiatry

## 2017-05-05 ENCOUNTER — Ambulatory Visit: Payer: BLUE CROSS/BLUE SHIELD

## 2017-05-05 DIAGNOSIS — M7662 Achilles tendinitis, left leg: Secondary | ICD-10-CM

## 2017-05-05 NOTE — Progress Notes (Signed)
This encounter was created in error - please disregard.

## 2019-05-13 ENCOUNTER — Other Ambulatory Visit: Payer: Self-pay | Admitting: Orthopedic Surgery

## 2019-05-13 DIAGNOSIS — R2242 Localized swelling, mass and lump, left lower limb: Secondary | ICD-10-CM

## 2019-05-17 ENCOUNTER — Ambulatory Visit: Payer: Medicare Other

## 2019-06-01 ENCOUNTER — Ambulatory Visit
Admission: RE | Admit: 2019-06-01 | Discharge: 2019-06-01 | Disposition: A | Discharge status: Home / Self Care | Payer: Medicare Other | Source: Ambulatory Visit | Attending: Orthopedic Surgery | Admitting: Orthopedic Surgery

## 2019-06-01 ENCOUNTER — Other Ambulatory Visit: Payer: Self-pay

## 2019-06-01 DIAGNOSIS — R2242 Localized swelling, mass and lump, left lower limb: Secondary | ICD-10-CM | POA: Diagnosis not present

## 2021-10-17 ENCOUNTER — Encounter: Payer: Self-pay | Admitting: Emergency Medicine

## 2021-10-17 ENCOUNTER — Other Ambulatory Visit: Payer: Self-pay

## 2021-10-17 ENCOUNTER — Emergency Department
Admission: EM | Admit: 2021-10-17 | Discharge: 2021-10-18 | Disposition: A | Discharge status: Home / Self Care | Payer: Medicare Other | Attending: Emergency Medicine | Admitting: Emergency Medicine

## 2021-10-17 ENCOUNTER — Emergency Department: Payer: Medicare Other

## 2021-10-17 DIAGNOSIS — S89092A Other physeal fracture of upper end of left tibia, initial encounter for closed fracture: Secondary | ICD-10-CM | POA: Diagnosis not present

## 2021-10-17 DIAGNOSIS — W1789XA Other fall from one level to another, initial encounter: Secondary | ICD-10-CM | POA: Diagnosis not present

## 2021-10-17 DIAGNOSIS — S82142A Displaced bicondylar fracture of left tibia, initial encounter for closed fracture: Secondary | ICD-10-CM | POA: Diagnosis present

## 2021-10-17 DIAGNOSIS — S82832A Other fracture of upper and lower end of left fibula, initial encounter for closed fracture: Secondary | ICD-10-CM | POA: Insufficient documentation

## 2021-10-17 DIAGNOSIS — S8992XA Unspecified injury of left lower leg, initial encounter: Secondary | ICD-10-CM | POA: Diagnosis present

## 2021-10-17 DIAGNOSIS — Z20822 Contact with and (suspected) exposure to covid-19: Secondary | ICD-10-CM | POA: Insufficient documentation

## 2021-10-17 MED ORDER — FENTANYL CITRATE PF 50 MCG/ML IJ SOSY
50.0000 ug | PREFILLED_SYRINGE | Freq: Once | INTRAMUSCULAR | Status: AC
  Administered 2021-10-17: 50 ug via INTRAVENOUS
  Filled 2021-10-17: qty 1

## 2021-10-17 NOTE — ED Provider Notes (Signed)
[facility] Provider Note    Event Date/Time   First MD Initiated Contact with Patient 10/17/21 2238     (approximate)   History   Fall   HPI  Claire Mccarthy is a 88 y.o. female arrives via EMS after mechanical fall from standing resulting in severe left knee pain and inability to ambulate as well as deformity.  Patient states that she was going to put on her close today when she had a mechanical fall forward landing directly onto this left knee on a hard surface.  Patient states that she was unable to get up and put any weight on this leg without significant pain that radiates up into her mid thigh and is worsened with any movement or palpation.     Physical Exam   Triage Vital Signs: ED Triage Vitals [10/17/21 2225]  Enc Vitals Group     BP      Pulse      Resp      Temp      Temp src      SpO2      Weight 100 lb (45.4 kg)     Height 4\' 9"  (1.448 m)     Head Circumference      Peak Flow      Pain Score 8     Pain Loc      Pain Edu?      Excl. in GC?     Most recent vital signs: There were no vitals filed for this visit.  General: Awake, no distress.  CV:  Good peripheral perfusion.  Resp:  Normal effort.  Abd:  No distention.  Other:  Elderly Caucasian female laying in bed motionless with significant swelling and deformity to the left knee as well as pain with any palpation or range of motion   ED Results / Procedures / Treatments   Labs (all labs ordered are listed, but only abnormal results are displayed) Labs Reviewed  RESP PANEL BY RT-PCR (FLU A&B, COVID) ARPGX2  CBC WITH DIFFERENTIAL/PLATELET  COMPREHENSIVE METABOLIC PANEL     EKG ED ECG REPORT I, , the attending physician, personally viewed and interpreted this ECG.  Date: 10/17/2021 EKG Time: 2221 Rate: 80 Rhythm: normal sinus rhythm QRS Axis: normal Intervals: normal ST/T Wave abnormalities: normal Narrative Interpretation: no evidence of acute  ischemia   RADIOLOGY ED MD interpretation: 4 view x-ray of the left knee shows mildly displaced fracture of the fibular neck and displaced oblique fracture of the proximal tibial metadiaphysis with possible rotation and partial subluxation of the knee.  Agree with radiology assessment  Official radiology report(s): DG Knee Complete 4 Views Left  Result Date: 10/17/2021 CLINICAL DATA:  Fall and trauma to the left knee. EXAM: LEFT KNEE - COMPLETE 4+ VIEW COMPARISON:  None. FINDINGS: Evaluation is limited due to advanced osteopenia and by positioning. There is a mildly displaced fracture of the fibular neck. There is a oblique fracture of the proximal tibial metadiaphysis with lateral displacement of the distal fracture fragment. Possible rotation of the knee or partial subluxation. No significant joint effusion. The soft tissue swelling of the knee. IMPRESSION: 1. Mildly displaced fracture of the fibular neck and displaced oblique fracture of the proximal tibial metadiaphysis. 2. Possible rotation or partial subluxation of the knee. Electronically Signed   By: 10/19/2021 M.D.   On: 10/17/2021 23:19      PROCEDURES:  Critical Care performed: No  Procedures   MEDICATIONS  ORDERED IN ED: Medications  fentaNYL (SUBLIMAZE) injection 50 mcg (50 mcg Intravenous Given 10/17/21 2338)     IMPRESSION / MDM / ASSESSMENT AND PLAN / ED COURSE  I reviewed the triage vital signs and the nursing notes.                              Differential diagnosis includes, but is not limited to, knee fracture, knee dislocation, femur fracture  The patient is on the cardiac monitor to evaluate for evidence of arrhythmia and/or significant heart rate changes.  Patient a 88 year old female presents after mechanical fall from standing complaining of severe left knee pain. Radiologic evaluation significant for fibular and tibial fracture. Given patient unable to ambulate on this fracture and will likely  require surgical intervention, she will will require admission to the hospital service for further evaluation and management. Consults: [facility] aware through secure chat Lab work pending at the end of my shift        FINAL CLINICAL IMPRESSION(S) / ED DIAGNOSES   Final diagnoses:  None     Rx / DC Orders   ED Discharge Orders     None        Note:  This document was prepared using Dragon voice recognition software and may include unintentional dictation errors.   Merwyn Katos, MD 10/18/21 [phone]

## 2021-10-17 NOTE — ED Notes (Signed)
Pt unsure of medical history or medications taken, requests to verify with daughter upon her arrival

## 2021-10-17 NOTE — ED Triage Notes (Signed)
Pt BIB [facility] after a fall, pt had bilateral knee cortisone shots today, is ambulatory with walker only, pt attempted to ambulate without walker and fell, denies LOC, hitting head and denies blood thinners; pt c/o left knee pain, with swelling, bruising and deformity noted

## 2021-10-17 NOTE — ED Notes (Signed)
Pt unable to sign for MSE 

## 2021-10-18 ENCOUNTER — Inpatient Hospital Stay: Payer: Medicare Other

## 2021-10-18 ENCOUNTER — Inpatient Hospital Stay (HOSPITAL_COMMUNITY): Payer: Medicare Other

## 2021-10-18 ENCOUNTER — Encounter (HOSPITAL_COMMUNITY): Payer: Self-pay | Admitting: Internal Medicine

## 2021-10-18 ENCOUNTER — Inpatient Hospital Stay (HOSPITAL_COMMUNITY)
Admission: AD | Admit: 2021-10-18 | Discharge: 2021-10-23 | DRG: 493 | Disposition: A | Discharge status: Skilled Nursing Facility | Payer: Medicare Other | Attending: Family Medicine | Admitting: Family Medicine

## 2021-10-18 DIAGNOSIS — E871 Hypo-osmolality and hyponatremia: Secondary | ICD-10-CM | POA: Diagnosis present

## 2021-10-18 DIAGNOSIS — Z8673 Personal history of transient ischemic attack (TIA), and cerebral infarction without residual deficits: Secondary | ICD-10-CM | POA: Diagnosis not present

## 2021-10-18 DIAGNOSIS — S82142A Displaced bicondylar fracture of left tibia, initial encounter for closed fracture: Secondary | ICD-10-CM | POA: Diagnosis not present

## 2021-10-18 DIAGNOSIS — S82202A Unspecified fracture of shaft of left tibia, initial encounter for closed fracture: Secondary | ICD-10-CM

## 2021-10-18 DIAGNOSIS — E039 Hypothyroidism, unspecified: Secondary | ICD-10-CM | POA: Diagnosis not present

## 2021-10-18 DIAGNOSIS — S82102A Unspecified fracture of upper end of left tibia, initial encounter for closed fracture: Secondary | ICD-10-CM | POA: Diagnosis present

## 2021-10-18 DIAGNOSIS — Z79899 Other long term (current) drug therapy: Secondary | ICD-10-CM | POA: Diagnosis not present

## 2021-10-18 DIAGNOSIS — S82252A Displaced comminuted fracture of shaft of left tibia, initial encounter for closed fracture: Secondary | ICD-10-CM | POA: Diagnosis not present

## 2021-10-18 DIAGNOSIS — M80862A Other osteoporosis with current pathological fracture, left lower leg, initial encounter for fracture: Secondary | ICD-10-CM | POA: Diagnosis not present

## 2021-10-18 DIAGNOSIS — S82402A Unspecified fracture of shaft of left fibula, initial encounter for closed fracture: Secondary | ICD-10-CM | POA: Diagnosis not present

## 2021-10-18 DIAGNOSIS — I10 Essential (primary) hypertension: Secondary | ICD-10-CM

## 2021-10-18 DIAGNOSIS — D62 Acute posthemorrhagic anemia: Secondary | ICD-10-CM | POA: Diagnosis not present

## 2021-10-18 DIAGNOSIS — W19XXXA Unspecified fall, initial encounter: Secondary | ICD-10-CM | POA: Diagnosis not present

## 2021-10-18 DIAGNOSIS — Y92009 Unspecified place in unspecified non-institutional (private) residence as the place of occurrence of the external cause: Secondary | ICD-10-CM

## 2021-10-18 DIAGNOSIS — Z823 Family history of stroke: Secondary | ICD-10-CM | POA: Diagnosis not present

## 2021-10-18 DIAGNOSIS — D72829 Elevated white blood cell count, unspecified: Secondary | ICD-10-CM | POA: Diagnosis present

## 2021-10-18 DIAGNOSIS — Z79890 Hormone replacement therapy (postmenopausal): Secondary | ICD-10-CM

## 2021-10-18 DIAGNOSIS — M80079A Age-related osteoporosis with current pathological fracture, unspecified ankle and foot, initial encounter for fracture: Secondary | ICD-10-CM | POA: Diagnosis present

## 2021-10-18 DIAGNOSIS — S82452A Displaced comminuted fracture of shaft of left fibula, initial encounter for closed fracture: Secondary | ICD-10-CM | POA: Diagnosis not present

## 2021-10-18 DIAGNOSIS — H9190 Unspecified hearing loss, unspecified ear: Secondary | ICD-10-CM | POA: Diagnosis present

## 2021-10-18 DIAGNOSIS — Z20822 Contact with and (suspected) exposure to covid-19: Secondary | ICD-10-CM | POA: Diagnosis not present

## 2021-10-18 DIAGNOSIS — Z8249 Family history of ischemic heart disease and other diseases of the circulatory system: Secondary | ICD-10-CM

## 2021-10-18 DIAGNOSIS — E785 Hyperlipidemia, unspecified: Secondary | ICD-10-CM | POA: Diagnosis not present

## 2021-10-18 DIAGNOSIS — S82209A Unspecified fracture of shaft of unspecified tibia, initial encounter for closed fracture: Principal | ICD-10-CM | POA: Insufficient documentation

## 2021-10-18 DIAGNOSIS — S82142S Displaced bicondylar fracture of left tibia, sequela: Secondary | ICD-10-CM | POA: Diagnosis not present

## 2021-10-18 DIAGNOSIS — M810 Age-related osteoporosis without current pathological fracture: Secondary | ICD-10-CM | POA: Diagnosis present

## 2021-10-18 DIAGNOSIS — K219 Gastro-esophageal reflux disease without esophagitis: Secondary | ICD-10-CM | POA: Diagnosis present

## 2021-10-18 DIAGNOSIS — S82832A Other fracture of upper and lower end of left fibula, initial encounter for closed fracture: Secondary | ICD-10-CM | POA: Diagnosis not present

## 2021-10-18 DIAGNOSIS — S82409A Unspecified fracture of shaft of unspecified fibula, initial encounter for closed fracture: Secondary | ICD-10-CM | POA: Insufficient documentation

## 2021-10-18 DIAGNOSIS — W1830XA Fall on same level, unspecified, initial encounter: Secondary | ICD-10-CM | POA: Diagnosis present

## 2021-10-18 DIAGNOSIS — Z419 Encounter for procedure for purposes other than remedying health state, unspecified: Secondary | ICD-10-CM

## 2021-10-18 DIAGNOSIS — F32A Depression, unspecified: Secondary | ICD-10-CM | POA: Diagnosis not present

## 2021-10-18 DIAGNOSIS — T148XXA Other injury of unspecified body region, initial encounter: Secondary | ICD-10-CM

## 2021-10-18 HISTORY — DX: Age-related osteoporosis without current pathological fracture: M81.0

## 2021-10-18 HISTORY — DX: Essential (primary) hypertension: I10

## 2021-10-18 HISTORY — DX: Dependence on other enabling machines and devices: Z99.89

## 2021-10-18 HISTORY — DX: Cerebral infarction, unspecified: I63.9

## 2021-10-18 HISTORY — DX: Gastro-esophageal reflux disease without esophagitis: K21.9

## 2021-10-18 LAB — COMPREHENSIVE METABOLIC PANEL
ALT: 15 U/L (ref 0–44)
AST: 20 U/L (ref 15–41)
Albumin: 3.8 g/dL (ref 3.5–5.0)
Alkaline Phosphatase: 59 U/L (ref 38–126)
Anion gap: 6 (ref 5–15)
BUN: 26 mg/dL — ABNORMAL HIGH (ref 8–23)
CO2: 24 mmol/L (ref 22–32)
Calcium: 8.9 mg/dL (ref 8.9–10.3)
Chloride: 104 mmol/L (ref 98–111)
Creatinine, Ser: 0.84 mg/dL (ref 0.44–1.00)
GFR, Estimated: >60 mL/min (ref >60)
Glucose, Bld: 180 mg/dL — ABNORMAL HIGH (ref 70–99)
Potassium: 4.3 mmol/L (ref 3.5–5.1)
Sodium: 134 mmol/L — ABNORMAL LOW (ref 135–145)
Total Bilirubin: 0.5 mg/dL (ref 0.3–1.2)
Total Protein: 6.6 g/dL (ref 6.5–8.1)

## 2021-10-18 LAB — RESP PANEL BY RT-PCR (FLU A&B, COVID) ARPGX2
Influenza A by PCR: NEGATIVE
Influenza B by PCR: NEGATIVE
SARS Coronavirus 2 by RT PCR: NEGATIVE

## 2021-10-18 LAB — CBC WITH DIFFERENTIAL/PLATELET
Abs Immature Granulocytes: 0.03 10*3/uL (ref 0.00–0.07)
Basophils Absolute: 0 10*3/uL (ref 0.0–0.1)
Basophils Relative: 0 %
Eosinophils Absolute: 0 10*3/uL (ref 0.0–0.5)
Eosinophils Relative: 0 %
HCT: 32.5 % — ABNORMAL LOW (ref 36.0–46.0)
Hemoglobin: 11 g/dL — ABNORMAL LOW (ref 12.0–15.0)
Immature Granulocytes: 1 %
Lymphocytes Relative: 16 %
Lymphs Abs: 1 10*3/uL (ref 0.7–4.0)
MCH: 30.5 pg (ref 26.0–34.0)
MCHC: 33.8 g/dL (ref 30.0–36.0)
MCV: 90 fL (ref 80.0–100.0)
Monocytes Absolute: 0.1 10*3/uL (ref 0.1–1.0)
Monocytes Relative: 1 %
Neutro Abs: 4.8 10*3/uL (ref 1.7–7.7)
Neutrophils Relative %: 82 %
Platelets: 357 10*3/uL (ref 150–400)
RBC: 3.61 MIL/uL — ABNORMAL LOW (ref 3.87–5.11)
RDW: 14.4 % (ref 11.5–15.5)
WBC: 5.9 10*3/uL (ref 4.0–10.5)
nRBC: 0 % (ref 0.0–0.2)

## 2021-10-18 LAB — BASIC METABOLIC PANEL
Anion gap: 7 (ref 5–15)
BUN: 21 mg/dL (ref 8–23)
CO2: 22 mmol/L (ref 22–32)
Calcium: 8.6 mg/dL — ABNORMAL LOW (ref 8.9–10.3)
Chloride: 105 mmol/L (ref 98–111)
Creatinine, Ser: 0.58 mg/dL (ref 0.44–1.00)
GFR, Estimated: >60 mL/min (ref >60)
Glucose, Bld: 149 mg/dL — ABNORMAL HIGH (ref 70–99)
Potassium: 3.9 mmol/L (ref 3.5–5.1)
Sodium: 134 mmol/L — ABNORMAL LOW (ref 135–145)

## 2021-10-18 LAB — CBC
HCT: 27 % — ABNORMAL LOW (ref 36.0–46.0)
Hemoglobin: 9 g/dL — ABNORMAL LOW (ref 12.0–15.0)
MCH: 30.1 pg (ref 26.0–34.0)
MCHC: 33.3 g/dL (ref 30.0–36.0)
MCV: 90.3 fL (ref 80.0–100.0)
Platelets: 328 10*3/uL (ref 150–400)
RBC: 2.99 MIL/uL — ABNORMAL LOW (ref 3.87–5.11)
RDW: 14.2 % (ref 11.5–15.5)
WBC: 15 10*3/uL — ABNORMAL HIGH (ref 4.0–10.5)
nRBC: 0 % (ref 0.0–0.2)

## 2021-10-18 MED ORDER — MAGNESIUM HYDROXIDE 400 MG/5ML PO SUSP: 30.0000 mL | Freq: Every day | ORAL | Status: DC | PRN

## 2021-10-18 MED ORDER — ONDANSETRON HCL 4 MG PO TABS: 4.0000 mg | ORAL_TABLET | Freq: Four times a day (QID) | ORAL | Status: DC | PRN

## 2021-10-18 MED ORDER — FENTANYL CITRATE PF 50 MCG/ML IJ SOSY
25.0000 ug | PREFILLED_SYRINGE | Freq: Once | INTRAMUSCULAR | Status: AC
  Administered 2021-10-18: 25 ug via INTRAVENOUS
  Filled 2021-10-18: qty 1

## 2021-10-18 MED ORDER — ONDANSETRON HCL 4 MG/2ML IJ SOLN: 4.0000 mg | Freq: Four times a day (QID) | INTRAMUSCULAR | Status: DC | PRN

## 2021-10-18 MED ORDER — BUPROPION HCL 75 MG PO TABS
75.0000 mg | ORAL_TABLET | Freq: Every day | ORAL | Status: DC
  Administered 2021-10-19 – 2021-10-23 (×5): 75 mg via ORAL
  Filled 2021-10-18 (×5): qty 1

## 2021-10-18 MED ORDER — SODIUM CHLORIDE 0.9 % IV SOLN: INTRAVENOUS | Status: DC

## 2021-10-18 MED ORDER — ACETAMINOPHEN 325 MG PO TABS
650.0000 mg | ORAL_TABLET | Freq: Four times a day (QID) | ORAL | Status: DC | PRN
  Administered 2021-10-18 (×2): 650 mg via ORAL
  Filled 2021-10-18 (×4): qty 2

## 2021-10-18 MED ORDER — HYDROMORPHONE HCL 1 MG/ML IJ SOLN
0.5000 mg | INTRAMUSCULAR | Status: DC | PRN
  Administered 2021-10-19 (×3): 0.5 mg via INTRAVENOUS
  Filled 2021-10-18 (×3): qty 0.5

## 2021-10-18 MED ORDER — POLYETHYLENE GLYCOL 3350 17 G PO PACK: 17.0000 g | PACK | Freq: Every day | ORAL | Status: DC | PRN

## 2021-10-18 MED ORDER — PANTOPRAZOLE SODIUM 40 MG PO TBEC
40.0000 mg | DELAYED_RELEASE_TABLET | Freq: Every day | ORAL | Status: DC
  Administered 2021-10-19 – 2021-10-23 (×5): 40 mg via ORAL
  Filled 2021-10-18 (×5): qty 1

## 2021-10-18 MED ORDER — AMLODIPINE BESYLATE 10 MG PO TABS
10.0000 mg | ORAL_TABLET | Freq: Every day | ORAL | Status: DC
  Administered 2021-10-19 – 2021-10-23 (×4): 10 mg via ORAL
  Filled 2021-10-18 (×5): qty 1

## 2021-10-18 MED ORDER — ACETAMINOPHEN 325 MG RE SUPP: 650.0000 mg | Freq: Four times a day (QID) | RECTAL | Status: DC | PRN

## 2021-10-18 MED ORDER — CARVEDILOL 6.25 MG PO TABS
6.2500 mg | ORAL_TABLET | Freq: Two times a day (BID) | ORAL | Status: DC
  Administered 2021-10-19 – 2021-10-23 (×8): 6.25 mg via ORAL
  Filled 2021-10-18 (×9): qty 1

## 2021-10-18 MED ORDER — FENTANYL CITRATE PF 50 MCG/ML IJ SOSY
50.0000 ug | PREFILLED_SYRINGE | INTRAMUSCULAR | Status: DC | PRN
Start: 2021-10-18 — End: 2021-10-18
  Administered 2021-10-18 (×3): 50 ug via INTRAVENOUS
  Filled 2021-10-18 (×3): qty 1

## 2021-10-18 MED ORDER — ATORVASTATIN CALCIUM 10 MG PO TABS
20.0000 mg | ORAL_TABLET | Freq: Every day | ORAL | Status: DC
  Administered 2021-10-19 – 2021-10-23 (×5): 20 mg via ORAL
  Filled 2021-10-18 (×5): qty 2

## 2021-10-18 MED ORDER — LEVOTHYROXINE SODIUM 50 MCG PO TABS
50.0000 ug | ORAL_TABLET | Freq: Every day | ORAL | Status: DC
  Administered 2021-10-20 – 2021-10-23 (×4): 50 ug via ORAL
  Filled 2021-10-18 (×4): qty 1

## 2021-10-18 MED ORDER — HYDROCODONE-ACETAMINOPHEN 5-325 MG PO TABS
1.0000 | ORAL_TABLET | Freq: Four times a day (QID) | ORAL | Status: DC | PRN
  Administered 2021-10-18: 23:00:00 2 via ORAL
  Administered 2021-10-19 – 2021-10-20 (×3): 1 via ORAL
  Filled 2021-10-18: qty 2
  Filled 2021-10-18 (×3): qty 1

## 2021-10-18 MED ORDER — TRAZODONE HCL 50 MG PO TABS: 25.0000 mg | ORAL_TABLET | Freq: Every evening | ORAL | Status: DC | PRN

## 2021-10-18 NOTE — H&P (Signed)
History and Physical   Claire Mccarthy AJG:[id number] DOB: [birth year] DOA: 10/18/2021  PCP: [facility]   Patient coming from: Home, transfer from [facility]  Chief Complaint: Fall and leg fracture  HPI: Claire Mccarthy is a 88 y.o. female with medical history significant of hypertension, hyperlipidemia hypothyroidism, depression, GERD, CVA who presents after a fall.  Patient had a fall at home while putting on her clothes.  Noted to have pain in the left knee after the fall.  Did not hit her head.  Significant pain in the left knee and lower extremity.  Denies fevers, chills, chest pain, shortness of breath, abdominal pain, constipation, diarrhea, nausea, vomiting.  Of note, patient is a very hard of hearing.  ED/[facility] Course: Vital signs of been stable.  Noted to have mild hyponatremia 134 which corrects considering glucose of 149.  CBC with mild anemia 11 initially and now 9.  New leukocytosis on repeat BMP today to 15.  Was negative for flu or COVID at [facility].  Left knee x-ray there showed tibia-fibula fracture and a left knee CT showed comminuted and displaced left tibia and fibula fracture.  Orthopedics was consulted and considered seeing patient at [facility] however due to the comminuted and displaced nature of the fracture felt patient would be better served by trauma team at [facility] and patient was transferred.  Initially transferred to the trauma service but on arrival asked for our service to admit again as we were the primary [facility] with them consulting.  Review of Systems: As per HPI otherwise all other systems reviewed and are negative.   No past medical history on file. Hypertension, hyperlipidemia, hypothyroidism, GERD, CVA   Past Surgical History:  Procedure Laterality Date   ABDOMINAL HYSTERECTOMY      Social History  reports that she has never smoked. She has never used smokeless tobacco. She reports that she does not drink alcohol  and does not use drugs.  Not on File  Family History  Problem Relation Age of Onset   Stroke Other    Hypertension Other   Reviewed on admission  Prior to Admission medications   Not on File  Outside records show bed list as below: Amlodipine 10 mg daily Coreg 6.25 mg twice daily Omeprazole daily Synthroid 50 mcg daily Atorvastatin 20 mg daily  Physical Exam: Vitals:   10/18/21 2051 10/18/21 2315  BP: (!) 129/43 (!) 139/57  Pulse: 91 85  Resp: 16 20  Temp: 98.5 F (36.9 C) 97.7 F (36.5 C)  TempSrc: Oral Oral  SpO2: 96% 93%   Physical Exam Constitutional:      General: She is not in acute distress.    Appearance: Normal appearance.  HENT:     Head: Normocephalic and atraumatic.     Mouth/Throat:     Mouth: Mucous membranes are moist.     Pharynx: Oropharynx is clear.  Eyes:     Extraocular Movements: Extraocular movements intact.     Pupils: Pupils are equal, round, and reactive to light.  Cardiovascular:     Rate and Rhythm: Normal rate. Rhythm irregular.     Pulses: Normal pulses.     Heart sounds: Normal heart sounds.  Pulmonary:     Effort: Pulmonary effort is normal. No respiratory distress.     Breath sounds: Normal breath sounds.  Abdominal:     General: Bowel sounds are normal. There is no distension.     Palpations: Abdomen is soft.  Tenderness: There is no abdominal tenderness.  Musculoskeletal:        General: No swelling or deformity.     Left lower leg: Edema present.  Skin:    General: Skin is warm and dry.  Neurological:     General: No focal deficit present.     Mental Status: Mental status is at baseline.   Labs on Admission: I have personally reviewed following labs and imaging studies  CBC: Recent Labs  Lab 10/17/21 2340 10/18/21 1531  WBC 5.9 15.0*  NEUTROABS 4.8  --   HGB 11.0* 9.0*  HCT 32.5* 27.0*  MCV 90.0 90.3  PLT 357 328    Basic Metabolic Panel: Recent Labs  Lab 10/17/21 2340 10/18/21 1531  NA 134* 134*   K 4.3 3.9  CL 104 105  CO2 24 22  GLUCOSE 180* 149*  BUN 26* 21  CREATININE 0.84 0.58  CALCIUM 8.9 8.6*    GFR: Estimated Creatinine Clearance: 26.2 mL/min (by C-G formula based on SCr of 0.58 mg/dL).  Liver Function Tests: Recent Labs  Lab 10/17/21 2340  AST 20  ALT 15  ALKPHOS 59  BILITOT 0.5  PROT 6.6  ALBUMIN 3.8    Urine analysis:    Component Value Date/Time   COLORURINE Yellow 12/22/2014 1416   APPEARANCEUR Clear 12/22/2014 1416   LABSPEC 1.004 12/22/2014 1416   PHURINE 7.0 12/22/2014 1416   GLUCOSEU 50 mg/dL 16/10/960403/31/2016 54091416   HGBUR 2+ 12/22/2014 1416   BILIRUBINUR Negative 12/22/2014 1416   KETONESUR Negative 12/22/2014 1416   PROTEINUR Negative 12/22/2014 1416   NITRITE Negative 12/22/2014 1416   LEUKOCYTESUR 3+ 12/22/2014 1416    Radiological Exams on Admission: CT Knee Left Wo Contrast  Result Date: 10/18/2021 CLINICAL DATA:  Fracture. EXAM: CT OF THE left KNEE WITHOUT CONTRAST TECHNIQUE: Multidetector CT imaging of the left knee was performed according to the standard protocol. Multiplanar CT image reconstructions were also generated. RADIATION DOSE REDUCTION: This exam was performed according to the departmental dose-optimization program which includes automated exposure control, adjustment of the mA and/or kV according to patient size and/or use of iterative reconstruction technique. COMPARISON:  Earlier radiograph dated 10/17/2021. FINDINGS: Bones/Joint/Cartilage There is a comminuted minimally displaced and impacted fracture of the fibular head and neck. There is an oblique and comminuted fracture of the proximal tibial metadiaphysis. There is approximately 5 mm lateral displacement of the distal fracture fragment and mild lateral angulation. There is severe osteopenia which limits evaluation for fracture. No definite dislocation. There is a small joint effusion. Ligaments Suboptimally assessed by CT. Muscles and Tendons No intramuscular hematoma. Soft  tissues Advanced atherosclerotic calcification. Diffuse subcutaneous edema. No fluid collection. IMPRESSION: 1. Comminuted and displaced fractures of the proximal tibia and fibula. 2. Small joint effusion. Electronically Signed   By: Elgie CollardArash  Radparvar M.D.   On: 10/18/2021 02:36   Chest Portable 1 View  Result Date: 10/18/2021 CLINICAL DATA:  Leukocytosis. EXAM: PORTABLE CHEST 1 VIEW COMPARISON:  Chest x-ray 06/03/2013. FINDINGS: Patient is rotated. Cardiomediastinal silhouette is grossly within normal limits. There is no lung consolidation, pleural effusion or pneumothorax. No acute fractures are seen. IMPRESSION: 1. Technically limited study. 2. No definite acute cardiopulmonary process. Electronically Signed   By: Darliss CheneyAmy  Guttmann M.D.   On: 10/18/2021 23:13   DG Knee Complete 4 Views Left  Result Date: 10/17/2021 CLINICAL DATA:  Fall and trauma to the left knee. EXAM: LEFT KNEE - COMPLETE 4+ VIEW COMPARISON:  None. FINDINGS: Evaluation is limited due  to advanced osteopenia and by positioning. There is a mildly displaced fracture of the fibular neck. There is a oblique fracture of the proximal tibial metadiaphysis with lateral displacement of the distal fracture fragment. Possible rotation of the knee or partial subluxation. No significant joint effusion. The soft tissue swelling of the knee. IMPRESSION: 1. Mildly displaced fracture of the fibular neck and displaced oblique fracture of the proximal tibial metadiaphysis. 2. Possible rotation or partial subluxation of the knee. Electronically Signed   By: Elgie CollardArash  Radparvar M.D.   On: 10/17/2021 23:19    EKG: Independently reviewed.  Sinus rhythm at 80 bpm.  Assessment/Plan Principal Problem:   Tibia/fibula fracture Active Problems:   HLD (hyperlipidemia)   HTN (hypertension)   Hypothyroidism   Personal history of transient ischemic attack (TIA), and cerebral infarction without residual deficits  Fall Tibiofibular fracture > Presenting after fall  with significant left knee pain.  Found to have tibial and fibular fracture on x-ray.  CT showing comminuted and displaced tibia-fibula fracture. > [facility] evaluation given comminuted and displaced nature fracture. - Appreciate orthopedics recommendations - Continue with bedrest - As needed pain medication - Monitor on telemetry with continuous pulse ox - Type and screen, chest x-ray, EKG  Leukocytosis > Noticed to have new leukocytosis on repeat CBC to 15. > Possibly reactive, patient is afebrile. - We will check urinalysis and checks x-ray - Trend CBC  Anemia > Hemoglobin 11 on admission now 9, has received IV fluids. > No evidence of bleeding  - Trend CBC - We will type and screen as above for surgery  Hypertension - Continue home amlodipine and carvedilol  Hyperlipidemia - Continue home atorvastatin  History of CVA - Continue home atorvastatin  Hypothyroidism - Continue home Synthroid  GERD - Continue home PPI  Depression - Continue home Wellbutrin  DVT prophylaxis: SCDs of nonfracture extremity only  Code Status:   Full  Family Communication:  None on admission  Disposition Plan:   Patient is from:  Home  Anticipated DC to:  Pending clinical course  Anticipated DC date:  2 To 4 days  Anticipated DC barriers: None  Consults called:  Orthopedics, consulted in the ED and the patient transferred to trauma orthopedics who are now consulting Admission status:  Inpatient, telemetry   Severity of Illness: The appropriate patient status for this patient is INPATIENT. Inpatient status is judged to be reasonable and necessary in order to provide the required intensity of service to ensure the patient's safety. The patient's presenting symptoms, physical exam findings, and initial radiographic and laboratory data in the context of their chronic comorbidities is felt to place them  at high risk for further clinical deterioration. Furthermore, it is not anticipated that the patient will be medically stable for discharge from the hospital within 2 midnights of admission.   * I certify that at the point of admission it is my clinical judgment that the patient will require inpatient hospital care spanning beyond 2 midnights from the point of admission due to high intensity of service, high risk for further deterioration and high frequency of surveillance required.Synetta Fail*   Eldin Bonsell B Gisella Alwine MD [facility]  How to contact the [facility] Attending or Consulting provider 7A - 7P or covering provider during after hours 7P -7A, for this patient?   Check the care team in [facility] and look for a) attending/consulting [facility] provider listed and b) the [facility] team  listed Log into [email or web address] and use [facility] universal password to access. If you do not have the password, please contact the hospital operator. Locate the [facility] provider you are looking for under [facility] and page to a number that you can be directly reached. If you still have difficulty reaching the provider, please page the [facility] (Director on Call) for the Hospitalists listed on amion for assistance.  10/19/2021, 12:39 AM

## 2021-10-18 NOTE — ED Provider Notes (Signed)
Discussed with Dr. Okey Dupre, he will see patient in ED this morning and assist [place] with consultation and plans for corrdination of transfer to [facility] (trauma ortho consult).  Presently,: Orthopedics Dr. Blanchie Dessert advising that the [facility] trauma Ortho team will have a better idea as to their availability for surgical evaluation and scheduling sometime around 730 or 8 AM today.     Sharyn Creamer, MD 10/18/21 [phone]

## 2021-10-18 NOTE — Consult Note (Signed)
ORTHOPAEDIC CONSULTATION  REQUESTING PHYSICIAN: Delman Kitten, MD  Chief Complaint: Left knee pain  HPI: Claire Mccarthy is a 88 y.o. female who complains of left knee pain after a fall from standing. The pain is sharp in character. The pain is worse with movement and better with rest. Denies any numbness, tingling or constitutional symptoms.  Patient is hard of hearing and most of the history is obtained from her daughter who is at bedside.  Patient lives with her family.  She ambulates with the use of a rolling walker.  She is not taking any anticoagulation.  She is a patient of Dr. Mack Guise at [facility].   History reviewed. No pertinent past medical history. Past Surgical History:  Procedure Laterality Date   ABDOMINAL HYSTERECTOMY     Social History   Socioeconomic History   Marital status: Widowed    Spouse name: Not on file   Number of children: Not on file   Years of education: Not on file   Highest education level: Not on file  Occupational History   Not on file  Tobacco Use   Smoking status: Never   Smokeless tobacco: Never  Substance and Sexual Activity   Alcohol use: Never   Drug use: Never   Sexual activity: Not on file  Other Topics Concern   Not on file  Social History Narrative   Not on file   Social Determinants of Health   Financial Resource Strain: Not on file  Food Insecurity: Not on file  Transportation Needs: Not on file  Physical Activity: Not on file  Stress: Not on file  Social Connections: Not on file   History reviewed. No pertinent family history. Not on File Prior to Admission medications   Not on File   CT Knee Left Wo Contrast  Result Date: 10/18/2021 CLINICAL DATA:  Fracture. EXAM: CT OF THE left KNEE WITHOUT CONTRAST TECHNIQUE: Multidetector CT imaging of the left knee was performed according to the standard protocol. Multiplanar CT image reconstructions were also generated. RADIATION DOSE REDUCTION: This exam was performed  according to the departmental dose-optimization program which includes automated exposure control, adjustment of the mA and/or kV according to patient size and/or use of iterative reconstruction technique. COMPARISON:  Earlier radiograph dated 10/17/2021. FINDINGS: Bones/Joint/Cartilage There is a comminuted minimally displaced and impacted fracture of the fibular head and neck. There is an oblique and comminuted fracture of the proximal tibial metadiaphysis. There is approximately 5 mm lateral displacement of the distal fracture fragment and mild lateral angulation. There is severe osteopenia which limits evaluation for fracture. No definite dislocation. There is a small joint effusion. Ligaments Suboptimally assessed by CT. Muscles and Tendons No intramuscular hematoma. Soft tissues Advanced atherosclerotic calcification. Diffuse subcutaneous edema. No fluid collection. IMPRESSION: 1. Comminuted and displaced fractures of the proximal tibia and fibula. 2. Small joint effusion. Electronically Signed   By: Anner Crete M.D.   On: 10/18/2021 02:36   DG Knee Complete 4 Views Left  Result Date: 10/17/2021 CLINICAL DATA:  Fall and trauma to the left knee. EXAM: LEFT KNEE - COMPLETE 4+ VIEW COMPARISON:  None. FINDINGS: Evaluation is limited due to advanced osteopenia and by positioning. There is a mildly displaced fracture of the fibular neck. There is a oblique fracture of the proximal tibial metadiaphysis with lateral displacement of the distal fracture fragment. Possible rotation of the knee or partial subluxation. No significant joint effusion. The soft tissue swelling of the knee. IMPRESSION: 1. Mildly displaced fracture of the  fibular neck and displaced oblique fracture of the proximal tibial metadiaphysis. 2. Possible rotation or partial subluxation of the knee. Electronically Signed   By: Anner Crete M.D.   On: 10/17/2021 23:19    Positive ROS: All other systems have been reviewed and were  otherwise negative with the exception of those mentioned in the HPI and as above.  Physical Exam: General: Alert, no acute distress Cardiovascular: No pedal edema Respiratory: No cyanosis, no use of accessory musculature GI: No organomegaly, abdomen is soft and non-tender Skin: No lesions in the area of chief complaint Neurologic: Sensation intact distally Psychiatric: Patient is competent for consent with normal mood and affect Lymphatic: No axillary or cervical lymphadenopathy  MUSCULOSKELETAL:  Left lower extremity: Knee immobilizer in place which is removed.  There is moderate swelling and ecchymosis along the anterior and lateral aspect of the leg.  No ecchymosis medially.  Skin is intact, no blistering.  Range of motion of the knee deferred. Compartments x4 are soft and compressible.  Good cap refill. Motor and sensory grossly intact distally.  Assessment: 88 year old female with a displaced left metaphyseal proximal tibia/fibula fracture  Plan: I had a long discussion with the patient and her family regarding potential treatment options moving forward, including both nonoperative treatment in a cast and potential operative treatment options as well as overall rehabilitation/healing time and risks and benefits of each.  The family is interested in pursuing surgical management.  I reached out to orthopedic trauma surgeons at [facility], who are working to find OR time for the patient over the next few days.  We appreciate their assistance taking care of this patient. This plan was communicated with the attending ER doctor, Dr. Corky Downs.    Renee Harder, MD    10/18/2021 8:05 AM

## 2021-10-18 NOTE — ED Provider Notes (Signed)
Bed placement is apparently searching for room for patient at [facility], possibly a trauma bed. I confirmed with [facility] that they have an admission order and admitting physician   Jene Every, MD 10/18/21 1458

## 2021-10-18 NOTE — ED Notes (Signed)
Daughter, Mrs. Raynald Blend updated on plan to transfer to [facility] after 0800 by [facility].

## 2021-10-18 NOTE — ED Notes (Signed)
Claire Mccarthy called and bed assigned 4N3   report [phone]

## 2021-10-18 NOTE — ED Notes (Signed)
Called [facility] spoke w/Ruby for posible transfer @ 2:27am

## 2021-10-18 NOTE — Progress Notes (Signed)
Requested provider be assigned to patient for [facility].

## 2021-10-18 NOTE — ED Provider Notes (Signed)
Updated patients daughter.  Also had updated the patient regarding our current plan of care.  Ongoing care has been assigned to Dr. Corky Downs, current our plan is for Dr. Sharlet Salina of orthopedics to see the patient and help [place] and assist with treatment recommendations, and at the present time: Orthopedics team is awaiting to review the case with Dr. Marcelino Scot ([facility]) to see if her case can be accommodated today  Dr. Corky Downs to follow-up on recommendations from the Ortho team   Delman Kitten, MD 10/18/21 [phone]

## 2021-10-18 NOTE — ED Notes (Signed)
Daughter, Mrs.Hillard, updated on pts transfer to [facility].

## 2021-10-18 NOTE — Progress Notes (Signed)
Patient pain assessed 9/10. MD made aware.

## 2021-10-18 NOTE — ED Notes (Signed)
Called Luanna Cole at [facility] to request consult for transfer by hospitalist  [phone]

## 2021-10-18 NOTE — ED Notes (Signed)
Checked with [facility], waiting for bed   they are checking on status

## 2021-10-18 NOTE — H&P (Addendum)
[facility]                                                               ER consult  PATIENT NAME: Claire Mccarthy    MR#:  [id number]  DATE OF BIRTH:  1925/05/27  DATE OF ADMISSION:  10/17/2021  PRIMARY CARE PHYSICIAN: [facility] [place]   Patient is coming from: Home  REQUESTING/REFERRING PHYSICIAN: Delman Kitten, MD  CHIEF COMPLAINT:   Chief Complaint  Patient presents with   Fall    HISTORY OF PRESENT ILLNESS:  Claire Mccarthy is a 88 y.o. Caucasian female with medical history significant for hypertension, intracranial hemorrhage, C. difficile, hypothyroidism and dyslipidemia, who presented to the ER with acute onset of fall from standing position while putting on her clothes with subsequent left knee pain and inability to ambulate to with associated deformity.  Her pain radiated to her thigh.  She denies any head injury.  No chest pain or palpitations.  No paresthesias or focal muscle weakness.  No dysuria, oliguria or hematuria or flank pain.  No cough or wheezing or dyspnea.  ED Course: When she came to the ER vital signs were within normal.  Labs revealed hyponatremia and a glucose of 180 with a BUN of 26 and otherwise unremarkable CMP.  CBC showed anemia.  Influenza antigens and COVID-19 PCR came back negative.  4knee.  EKG as reviewed by me : EKG showed normal sinus rhythm with a rate of 80 with Q waves in V1 and V2. Imaging: 2 view knee x-ray showed mild displaced fracture of the fibular neck and displaced oblique fracture of the proximal tibial metadiaphysis with possible rotation or partial subluxation of the left knee.  The patient was given 25 mcg of IV fentanyl and later 50 mcg.  Dr. Sharlet Salina was notified about the patient and initially excepted the patient for admission and recommended left knee CT scan. PAST MEDICAL HISTORY:  Hypertension, intracranial hemorrhage, C. difficile, hypothyroidism and dyslipidemia,  PAST SURGICAL HISTORY:   Past  Surgical History:  Procedure Laterality Date   ABDOMINAL HYSTERECTOMY      SOCIAL HISTORY:   Social History   Tobacco Use   Smoking status: Never   Smokeless tobacco: Never  Substance Use Topics   Alcohol use: Never  No reported history of tobacco EtOH abuse or illicit drug use.  FAMILY HISTORY:   Positive for hypertension and coronary artery disease.  DRUG ALLERGIES:  Not on File  REVIEW OF SYSTEMS:   ROS As per history of present illness. All pertinent systems were reviewed above. Constitutional, HEENT, cardiovascular, respiratory, GI, GU, musculoskeletal, neuro, psychiatric, endocrine, integumentary and hematologic systems were reviewed and are otherwise negative/unremarkable except for positive findings mentioned above in the HPI.   MEDICATIONS AT HOME:   Prior to Admission medications   Not on File      VITAL SIGNS:  Blood pressure 136/78, pulse 88, temperature 97.9 F (36.6 C), temperature source Oral, resp. rate 19, height 4\' 9"  (1.448 m), weight 45.4 kg, SpO2 98 %.  PHYSICAL EXAMINATION:  Physical Exam  GENERAL:  88 y.o.-year-old Caucasian female patient lying in the bed with no acute distress.  EYES: Pupils equal, round, reactive to light and accommodation. No scleral icterus. Extraocular  muscles intact.  HEENT: Head atraumatic, normocephalic. Oropharynx and nasopharynx clear.  NECK:  Supple, no jugular venous distention. No thyroid enlargement, no tenderness.  LUNGS: Normal breath sounds bilaterally, no wheezing, rales,rhonchi or crepitation. No use of accessory muscles of respiration.  CARDIOVASCULAR: Regular rate and rhythm, S1, S2 normal. No murmurs, rubs, or gallops.  ABDOMEN: Soft, nondistended, nontender. Bowel sounds present. No organomegaly or mass.  EXTREMITIES: No pedal edema, cyanosis, or clubbing. Musculoskeletal: Left knee in knee immobilizer. NEUROLOGIC: Cranial nerves II through XII are intact. Muscle strength 5/5 in all extremities.  Sensation intact. Gait not checked.  PSYCHIATRIC: The patient is alert and oriented x 3.  Normal affect and good eye contact. SKIN: No obvious rash, lesion, or ulcer.   LABORATORY PANEL:   CBC Recent Labs  Lab 10/17/21 2340  WBC 5.9  HGB 11.0*  HCT 32.5*  PLT 357   ------------------------------------------------------------------------------------------------------------------  Chemistries  Recent Labs  Lab 10/17/21 2340  NA 134*  K 4.3  CL 104  CO2 24  GLUCOSE 180*  BUN 26*  CREATININE 0.84  CALCIUM 8.9  AST 20  ALT 15  ALKPHOS 59  BILITOT 0.5   ------------------------------------------------------------------------------------------------------------------  Cardiac Enzymes No results for input(s): TROPONINI in the last 168 hours. ------------------------------------------------------------------------------------------------------------------  RADIOLOGY:  DG Knee Complete 4 Views Left  Result Date: 10/17/2021 CLINICAL DATA:  Fall and trauma to the left knee. EXAM: LEFT KNEE - COMPLETE 4+ VIEW COMPARISON:  None. FINDINGS: Evaluation is limited due to advanced osteopenia and by positioning. There is a mildly displaced fracture of the fibular neck. There is a oblique fracture of the proximal tibial metadiaphysis with lateral displacement of the distal fracture fragment. Possible rotation of the knee or partial subluxation. No significant joint effusion. The soft tissue swelling of the knee. IMPRESSION: 1. Mildly displaced fracture of the fibular neck and displaced oblique fracture of the proximal tibial metadiaphysis. 2. Possible rotation or partial subluxation of the knee. Electronically Signed   By: Anner Crete M.D.   On: 10/17/2021 23:19      IMPRESSION AND PLAN:  Principal Problem:   Closed fracture of left tibial plateau  1.  Closed left tib-fib/fib fracture secondary to mechanical fall. - The patient was going to be admitted to a medical-surgical  bed. - Pain management was ordered. - Orthopedic consultation was obtained by Dr. Jacqualine Code. - I notified Dr. Sharlet Salina about the patient.  He initially accepted the patient to be admitted here but ordered a left knee CT, and after reviewing it , he stated that this specific type of fracture, the patient would be better served by a trauma surgeon.  Dr. Jacqualine Code was notified and will arrange for transfer to [facility]. - She will have a knee immobilizer.  2.  Mild hyponatremia. - The patient will be hydrated with IV normal saline and BMP can be followed.  3.  Essential hypertension. - We will continue amlodipine and Coreg.  4.  Dyslipidemia. - We will continue statin therapy  5.  Hypothyroidism. - We will continue Synthroid.  6.  Depression. - We will continue Wellbutrin.  7.  GERD. - Continue PPI therapy.  DVT prophylaxis: SCDs for now.   Code Status: full code.  This was discussed with her and she wants to further think about. Family Communication:  The plan of care was discussed in details with the patient (and family). I answered all questions. The patient agreed to proceed with the above mentioned plan. Further management will depend upon hospital course.  Disposition Plan: Transfer to [place] called: Orthopedic consult.  All the records are reviewed and case discussed with ED provider.  Status is: Transfer to [facility]   Thank you Dr. Jacqualine Code for allowing me to participate in the care of this very pleasant lady.  Christel Mormon M.D on 10/18/2021 at 1:21 AM  [facility]   From 7 PM-7 AM, contact night-coverage [email or web address]  CC: Primary care physician; [facility] [place]

## 2021-10-18 NOTE — ED Provider Notes (Signed)
Patient reports pain is worsening again.  Earlier received fentanyl.  She is awake alert and oriented reporting pain in her left knee.  Will give additional fentanyl IV at this time.  Family also at bedside.  Patient and family understanding of plan for admission, pending consultation with Dr. Okey Dupre  Patient will be admitted to the hospitalist service given her age and comorbidities with orthopedics consult  Left lower extremity will be placed into position of comfort at attempt to see if placement into a bulky knee immobilizer may be helpful.  At the present time she has soft compartments but does have bruising and some edema over the tibial plateau region, distal neurovascular examination is normal at this time  Consult for admission request discussed with Dr. Lenoria Farrier, MD 10/18/21 [phone]

## 2021-10-18 NOTE — ED Notes (Signed)
Pt turned and positioned, left sock removed due to swelling in left leg. Family with pt. Pt cleaned after urinating in bed, purewic replaced in proper position, pads and brief changed.

## 2021-10-18 NOTE — ED Provider Notes (Signed)
Discussed with the patient and her daughter both.  Both understanding agreeable with our plan now to attempt transfer to [facility] to see orthopedic trauma.  Dr. Sharlet Salina of orthopedics here advising this complex fracture would be best served with a Ortho trauma specialist which is available at [facility]  Patient and her daughter both understanding agreeable with the plan to transfer.  At the present time ROM I am awaiting callback from orthopedics at [facility], have previously attempted to reach them about half hour ago but have not yet heard back.  Requesting transfer to [facility]   Delman Kitten, MD 10/18/21 [phone]

## 2021-10-18 NOTE — ED Notes (Signed)
Pt resting at present, family in room. VSS.

## 2021-10-19 ENCOUNTER — Encounter (HOSPITAL_COMMUNITY)
Admission: AD | Disposition: A | Discharge status: Skilled Nursing Facility | Payer: Self-pay | Source: Home / Self Care | Attending: Internal Medicine

## 2021-10-19 ENCOUNTER — Encounter (HOSPITAL_COMMUNITY): Payer: Self-pay | Admitting: Internal Medicine

## 2021-10-19 ENCOUNTER — Inpatient Hospital Stay (HOSPITAL_COMMUNITY): Payer: Medicare Other

## 2021-10-19 ENCOUNTER — Other Ambulatory Visit: Payer: Self-pay

## 2021-10-19 ENCOUNTER — Inpatient Hospital Stay (HOSPITAL_COMMUNITY): Payer: Medicare Other | Admitting: Anesthesiology

## 2021-10-19 DIAGNOSIS — M80079A Age-related osteoporosis with current pathological fracture, unspecified ankle and foot, initial encounter for fracture: Secondary | ICD-10-CM

## 2021-10-19 DIAGNOSIS — S82142S Displaced bicondylar fracture of left tibia, sequela: Secondary | ICD-10-CM

## 2021-10-19 DIAGNOSIS — M810 Age-related osteoporosis without current pathological fracture: Secondary | ICD-10-CM | POA: Diagnosis present

## 2021-10-19 HISTORY — PX: ORIF TIBIA PLATEAU: SHX2132

## 2021-10-19 HISTORY — DX: Age-related osteoporosis with current pathological fracture, unspecified ankle and foot, initial encounter for fracture: M80.079A

## 2021-10-19 LAB — URINALYSIS, COMPLETE (UACMP) WITH MICROSCOPIC
Bacteria, UA: NONE SEEN
Bilirubin Urine: NEGATIVE
Glucose, UA: NEGATIVE mg/dL
Ketones, ur: NEGATIVE mg/dL
Nitrite: NEGATIVE
Protein, ur: NEGATIVE mg/dL
Specific Gravity, Urine: 1.013 (ref 1.005–1.030)
WBC, UA: >50 WBC/hpf — ABNORMAL HIGH (ref 0–5)
pH: 7 (ref 5.0–8.0)

## 2021-10-19 LAB — CBC
HCT: 24.7 % — ABNORMAL LOW (ref 36.0–46.0)
Hemoglobin: 8.5 g/dL — ABNORMAL LOW (ref 12.0–15.0)
MCH: 30.6 pg (ref 26.0–34.0)
MCHC: 34.4 g/dL (ref 30.0–36.0)
MCV: 88.8 fL (ref 80.0–100.0)
Platelets: 284 10*3/uL (ref 150–400)
RBC: 2.78 MIL/uL — ABNORMAL LOW (ref 3.87–5.11)
RDW: 14.5 % (ref 11.5–15.5)
WBC: 14.4 10*3/uL — ABNORMAL HIGH (ref 4.0–10.5)
nRBC: 0 % (ref 0.0–0.2)

## 2021-10-19 LAB — BASIC METABOLIC PANEL
Anion gap: 5 (ref 5–15)
BUN: 19 mg/dL (ref 8–23)
CO2: 22 mmol/L (ref 22–32)
Calcium: 8.2 mg/dL — ABNORMAL LOW (ref 8.9–10.3)
Chloride: 108 mmol/L (ref 98–111)
Creatinine, Ser: 0.68 mg/dL (ref 0.44–1.00)
GFR, Estimated: >60 mL/min (ref >60)
Glucose, Bld: 123 mg/dL — ABNORMAL HIGH (ref 70–99)
Potassium: 4 mmol/L (ref 3.5–5.1)
Sodium: 135 mmol/L (ref 135–145)

## 2021-10-19 LAB — TYPE AND SCREEN
ABO/RH(D): O POS
Antibody Screen: NEGATIVE

## 2021-10-19 LAB — ABO/RH: ABO/RH(D): O POS

## 2021-10-19 LAB — MRSA NEXT GEN BY PCR, NASAL: MRSA by PCR Next Gen: NOT DETECTED

## 2021-10-19 SURGERY — OPEN REDUCTION INTERNAL FIXATION (ORIF) TIBIAL PLATEAU
Anesthesia: Monitor Anesthesia Care | Site: Leg Lower | Laterality: Left

## 2021-10-19 MED ORDER — CEFAZOLIN SODIUM-DEXTROSE 2-4 GM/100ML-% IV SOLN
2.0000 g | Freq: Two times a day (BID) | INTRAVENOUS | Status: AC
  Administered 2021-10-19 – 2021-10-20 (×3): 2 g via INTRAVENOUS
  Filled 2021-10-19 (×3): qty 100

## 2021-10-19 MED ORDER — LACTATED RINGERS IV SOLN
INTRAVENOUS | Status: DC | PRN
Start: 2021-10-19 — End: 2021-10-19

## 2021-10-19 MED ORDER — CHLORHEXIDINE GLUCONATE 0.12 % MT SOLN: 15.0000 mL | Freq: Once | OROMUCOSAL | Status: AC

## 2021-10-19 MED ORDER — ASPIRIN EC 81 MG PO TBEC
81.0000 mg | DELAYED_RELEASE_TABLET | Freq: Two times a day (BID) | ORAL | Status: DC
  Administered 2021-10-20 – 2021-10-21 (×4): 81 mg via ORAL
  Filled 2021-10-19 (×4): qty 1

## 2021-10-19 MED ORDER — PROPOFOL 500 MG/50ML IV EMUL
INTRAVENOUS | Status: DC | PRN
  Administered 2021-10-19: 25 ug/kg/min via INTRAVENOUS

## 2021-10-19 MED ORDER — 0.9 % SODIUM CHLORIDE (POUR BTL) OPTIME
TOPICAL | Status: DC | PRN
  Administered 2021-10-19: 1000 mL

## 2021-10-19 MED ORDER — ONDANSETRON HCL 4 MG/2ML IJ SOLN
INTRAMUSCULAR | Status: DC | PRN
Start: 2021-10-19 — End: 2021-10-19
  Administered 2021-10-19: 4 mg via INTRAVENOUS

## 2021-10-19 MED ORDER — VANCOMYCIN HCL 1000 MG IV SOLR: INTRAVENOUS | Status: DC | PRN

## 2021-10-19 MED ORDER — METOCLOPRAMIDE HCL 10 MG PO TABS: 5.0000 mg | ORAL_TABLET | Freq: Three times a day (TID) | ORAL | Status: DC | PRN

## 2021-10-19 MED ORDER — ONDANSETRON HCL 4 MG PO TABS: 4.0000 mg | ORAL_TABLET | Freq: Four times a day (QID) | ORAL | Status: DC | PRN

## 2021-10-19 MED ORDER — VANCOMYCIN HCL 1000 MG IV SOLR
INTRAVENOUS | Status: AC
  Filled 2021-10-19: qty 20

## 2021-10-19 MED ORDER — METOCLOPRAMIDE HCL 5 MG/ML IJ SOLN: 5.0000 mg | Freq: Three times a day (TID) | INTRAMUSCULAR | Status: DC | PRN

## 2021-10-19 MED ORDER — LIDOCAINE HCL (CARDIAC) PF 100 MG/5ML IV SOSY
PREFILLED_SYRINGE | INTRAVENOUS | Status: DC | PRN
Start: 2021-10-19 — End: 2021-10-19
  Administered 2021-10-19: 40 mg via INTRAVENOUS

## 2021-10-19 MED ORDER — FENTANYL CITRATE (PF) 100 MCG/2ML IJ SOLN: 25.0000 ug | INTRAMUSCULAR | Status: DC | PRN

## 2021-10-19 MED ORDER — LACTATED RINGERS IV SOLN: INTRAVENOUS | Status: DC

## 2021-10-19 MED ORDER — CHLORHEXIDINE GLUCONATE 0.12 % MT SOLN
OROMUCOSAL | Status: AC
  Administered 2021-10-19: 15 mL via OROMUCOSAL
  Filled 2021-10-19: qty 15

## 2021-10-19 MED ORDER — DEXAMETHASONE SODIUM PHOSPHATE 10 MG/ML IJ SOLN
INTRAMUSCULAR | Status: DC | PRN
  Administered 2021-10-19: 5 mg via INTRAVENOUS

## 2021-10-19 MED ORDER — FENTANYL CITRATE (PF) 250 MCG/5ML IJ SOLN
INTRAMUSCULAR | Status: AC
  Filled 2021-10-19: qty 5

## 2021-10-19 MED ORDER — DOCUSATE SODIUM 100 MG PO CAPS
100.0000 mg | ORAL_CAPSULE | Freq: Two times a day (BID) | ORAL | Status: DC
  Administered 2021-10-19 – 2021-10-21 (×4): 100 mg via ORAL
  Filled 2021-10-19 (×4): qty 1

## 2021-10-19 MED ORDER — SODIUM CHLORIDE 0.9 % IV SOLN: INTRAVENOUS | Status: DC

## 2021-10-19 MED ORDER — DIPHENHYDRAMINE HCL 12.5 MG/5ML PO ELIX: 12.5000 mg | ORAL_SOLUTION | ORAL | Status: DC | PRN

## 2021-10-19 MED ORDER — LIDOCAINE 2% (20 MG/ML) 5 ML SYRINGE
INTRAMUSCULAR | Status: AC
  Filled 2021-10-19: qty 5

## 2021-10-19 MED ORDER — PROPOFOL 10 MG/ML IV BOLUS
INTRAVENOUS | Status: AC
  Filled 2021-10-19: qty 20

## 2021-10-19 MED ORDER — PROPOFOL 10 MG/ML IV BOLUS
INTRAVENOUS | Status: DC | PRN
  Administered 2021-10-19: 30 mg via INTRAVENOUS

## 2021-10-19 MED ORDER — CEFAZOLIN SODIUM-DEXTROSE 2-4 GM/100ML-% IV SOLN
2.0000 g | INTRAVENOUS | Status: DC
  Filled 2021-10-19: qty 100

## 2021-10-19 MED ORDER — ONDANSETRON HCL 4 MG/2ML IJ SOLN: 4.0000 mg | Freq: Four times a day (QID) | INTRAMUSCULAR | Status: DC | PRN

## 2021-10-19 MED ORDER — BUPIVACAINE IN DEXTROSE 0.75-8.25 % IT SOLN
INTRATHECAL | Status: DC | PRN
Start: 2021-10-19 — End: 2021-10-19
  Administered 2021-10-19: 1.2 mL via INTRATHECAL

## 2021-10-19 MED ORDER — PHENYLEPHRINE 40 MCG/ML (10ML) SYRINGE FOR IV PUSH (FOR BLOOD PRESSURE SUPPORT)
PREFILLED_SYRINGE | INTRAVENOUS | Status: AC
  Filled 2021-10-19: qty 10

## 2021-10-19 MED ORDER — PHENYLEPHRINE HCL (PRESSORS) 10 MG/ML IV SOLN
INTRAVENOUS | Status: DC | PRN
  Administered 2021-10-19 (×2): 40 ug via INTRAVENOUS
  Administered 2021-10-19 (×6): 80 ug via INTRAVENOUS

## 2021-10-19 MED ORDER — CEFAZOLIN SODIUM-DEXTROSE 2-3 GM-%(50ML) IV SOLR
INTRAVENOUS | Status: DC | PRN
  Administered 2021-10-19: 2 g via INTRAVENOUS

## 2021-10-19 MED ORDER — ORAL CARE MOUTH RINSE: 15.0000 mL | Freq: Once | OROMUCOSAL | Status: AC

## 2021-10-19 MED ORDER — FENTANYL CITRATE (PF) 250 MCG/5ML IJ SOLN
INTRAMUSCULAR | Status: DC | PRN
  Administered 2021-10-19: 50 ug via INTRAVENOUS

## 2021-10-19 SURGICAL SUPPLY — 77 items
BAG COUNTER SPONGE SURGICOUNT (BAG) ×2 IMPLANT
BANDAGE ESMARK 6X9 LF (GAUZE/BANDAGES/DRESSINGS) ×1 IMPLANT
BIT DRILL QC 2.5X240 (BIT) ×1 IMPLANT
BIT DRILL QC SFS 2.8X200 (BIT) ×1 IMPLANT
BLADE CLIPPER SURG (BLADE) IMPLANT
BLADE SURG 15 STRL LF DISP TIS (BLADE) ×1 IMPLANT
BLADE SURG 15 STRL SS (BLADE)
BNDG ELASTIC 4X5.8 VLCR STR LF (GAUZE/BANDAGES/DRESSINGS) ×2 IMPLANT
BNDG ELASTIC 6X5.8 VLCR STR LF (GAUZE/BANDAGES/DRESSINGS) ×2 IMPLANT
BNDG ESMARK 6X9 LF (GAUZE/BANDAGES/DRESSINGS) ×2
BNDG GAUZE ELAST 4 BULKY (GAUZE/BANDAGES/DRESSINGS) ×1 IMPLANT
BRUSH SCRUB EZ PLAIN DRY (MISCELLANEOUS) ×3 IMPLANT
CANISTER SUCT 3000ML PPV (MISCELLANEOUS) ×2 IMPLANT
CHLORAPREP W/TINT 26 (MISCELLANEOUS) ×4 IMPLANT
COVER SURGICAL LIGHT HANDLE (MISCELLANEOUS) ×2 IMPLANT
CUFF TOURN SGL QUICK 34 (TOURNIQUET CUFF) ×1
CUFF TRNQT CYL 34X4.125X (TOURNIQUET CUFF) ×1 IMPLANT
DRAPE C-ARM 42X72 X-RAY (DRAPES) ×2 IMPLANT
DRAPE C-ARMOR (DRAPES) ×2 IMPLANT
DRAPE ORTHO SPLIT 77X108 STRL (DRAPES) ×2
DRAPE SURG ORHT 6 SPLT 77X108 (DRAPES) ×2 IMPLANT
DRAPE U-SHAPE 47X51 STRL (DRAPES) ×2 IMPLANT
DRSG MEPITEL 4X7.2 (GAUZE/BANDAGES/DRESSINGS) ×1 IMPLANT
DRSG PAD ABDOMINAL 8X10 ST (GAUZE/BANDAGES/DRESSINGS) ×2 IMPLANT
ELECT REM PT RETURN 9FT ADLT (ELECTROSURGICAL) ×2
ELECTRODE REM PT RTRN 9FT ADLT (ELECTROSURGICAL) ×1 IMPLANT
GAUZE SPONGE 4X4 12PLY STRL (GAUZE/BANDAGES/DRESSINGS) ×2 IMPLANT
GLOVE SURG ENC MOIS LTX SZ6.5 (GLOVE) ×6 IMPLANT
GLOVE SURG ENC MOIS LTX SZ7.5 (GLOVE) ×8 IMPLANT
GLOVE SURG UNDER POLY LF SZ6.5 (GLOVE) ×2 IMPLANT
GLOVE SURG UNDER POLY LF SZ7.5 (GLOVE) ×2 IMPLANT
GOWN STRL REUS W/ TWL LRG LVL3 (GOWN DISPOSABLE) ×2 IMPLANT
GOWN STRL REUS W/TWL LRG LVL3 (GOWN DISPOSABLE) ×2
IMMOBILIZER KNEE 22 UNIV (SOFTGOODS) ×1 IMPLANT
K-WIRE 2.0X150M (WIRE) ×2
KIT BASIN OR (CUSTOM PROCEDURE TRAY) ×2 IMPLANT
KIT TURNOVER KIT B (KITS) ×2 IMPLANT
KWIRE 2.0X150M (WIRE) IMPLANT
NDL SUT 6 .5 CRC .975X.05 MAYO (NEEDLE) ×1 IMPLANT
NEEDLE MAYO TAPER (NEEDLE)
NS IRRIG 1000ML POUR BTL (IV SOLUTION) ×2 IMPLANT
PACK TOTAL JOINT (CUSTOM PROCEDURE TRAY) ×2 IMPLANT
PAD ARMBOARD 7.5X6 YLW CONV (MISCELLANEOUS) ×4 IMPLANT
PAD CAST 4YDX4 CTTN HI CHSV (CAST SUPPLIES) ×1 IMPLANT
PADDING CAST COTTON 4X4 STRL (CAST SUPPLIES) ×1
PADDING CAST COTTON 6X4 STRL (CAST SUPPLIES) ×2 IMPLANT
PLATE PROX TIBIA VA-LCP (Plate) ×1 IMPLANT
SCREW 3.5X60 (Screw) ×1 IMPLANT
SCREW CORTEX 3.5 30MM (Screw) ×1 IMPLANT
SCREW CORTEX 3.5 70MM SELF TAP (Screw) ×1 IMPLANT
SCREW LOCK CORT ST 3.5X30 (Screw) IMPLANT
SCREW LOCKING 3.5X26 (Screw) ×2 IMPLANT
SCREW LOCKING 3.5X70MM VA (Screw) ×1 IMPLANT
SCREW LOCKING VA 3.5X26MM (Screw) ×1 IMPLANT
SCREW LOCKING VA 3.5X50MM (Screw) ×1 IMPLANT
SCREW VA-LOCKING 65MM 3.5 (Screw) ×1 IMPLANT
STAPLER VISISTAT 35W (STAPLE) ×2 IMPLANT
STRIP CLOSURE SKIN 1/2X4 (GAUZE/BANDAGES/DRESSINGS) ×1 IMPLANT
SUCTION FRAZIER HANDLE 10FR (MISCELLANEOUS) ×1
SUCTION TUBE FRAZIER 10FR DISP (MISCELLANEOUS) ×1 IMPLANT
SUT ETHILON 2 0 FS 18 (SUTURE) ×1 IMPLANT
SUT ETHILON 3 0 PS 1 (SUTURE) ×2 IMPLANT
SUT FIBERWIRE #2 38 T-5 BLUE (SUTURE)
SUT VIC AB 0 CT1 18XCR BRD 8 (SUTURE) IMPLANT
SUT VIC AB 0 CT1 27 (SUTURE) ×1
SUT VIC AB 0 CT1 27XBRD ANBCTR (SUTURE) IMPLANT
SUT VIC AB 0 CT1 8-18 (SUTURE) ×1
SUT VIC AB 1 CT1 18XCR BRD 8 (SUTURE) IMPLANT
SUT VIC AB 1 CT1 27 (SUTURE)
SUT VIC AB 1 CT1 27XBRD ANBCTR (SUTURE) ×1 IMPLANT
SUT VIC AB 1 CT1 8-18 (SUTURE)
SUT VIC AB 2-0 CT1 27 (SUTURE) ×1
SUT VIC AB 2-0 CT1 TAPERPNT 27 (SUTURE) ×2 IMPLANT
SUTURE FIBERWR #2 38 T-5 BLUE (SUTURE) IMPLANT
TOWEL GREEN STERILE (TOWEL DISPOSABLE) ×4 IMPLANT
TRAY FOLEY MTR SLVR 16FR STAT (SET/KITS/TRAYS/PACK) IMPLANT
WATER STERILE IRR 1000ML POUR (IV SOLUTION) ×2 IMPLANT

## 2021-10-19 NOTE — TOC CAGE-AID Note (Signed)
Transition of Care [facility]) - CAGE-AID Screening   Patient Details  Name: Claire Mccarthy MRN: [id number] Date of Birth: Mar 02, 1925  Transition of Care [facility]) CM/SW Contact:    Janayia Burggraf C Tarpley-Carter, LCSWA Phone Number: 10/19/2021, 8:31 AM   Clinical Narrative: Pt is unable to participate in Cage Aid. Pt is not appropriate for assessment.  Djuna Frechette Tarpley-Carter, MSW, LCSW-A Pronouns:  She/Her/Hers [place] Transitions of Care Clinical Social Worker Direct Number:  [phone] [email or web address]  CAGE-AID Screening: Substance Abuse Screening unable to be completed due to: : Patient unable to participate             Substance Abuse Education Offered: No

## 2021-10-19 NOTE — Progress Notes (Signed)
PHARMACY NOTE:  ANTIMICROBIAL RENAL DOSAGE ADJUSTMENT  Current antimicrobial regimen includes a mismatch between antimicrobial dosage and estimated renal function.  As per policy approved by the Pharmacy & Therapeutics and Medical Executive Committees, the antimicrobial dosage will be adjusted accordingly.  Current antimicrobial dosage:  ordered Cefazolin 2g IV q8h x3 doses post op   Indication: surgical prophylaxis  Renal Function: 26 ml/min    (Age 88, weight 45.5 kg, height 35ft 9 in, SCr = 0.68  (used Scr 1.0 due to age).   Estimated Creatinine Clearance: 26.2 mL/min (by C-G formula based on SCr of 0.68 mg/dL). []      On intermittent HD, scheduled: []      On CRRT    Antimicrobial dosage has been changed to:   Cefazolin 2g IV dosing interval changed to q12 hours for CrCl 10-30 ml/min.   Additional comments: n/a    Thank you for allowing pharmacy to be a part of this patient's care. , RPh Clinical Pharmacist 10/19/2021 8:52 PM Please check AMION for all [facility] phone numbers After 10:00 PM, call Main Pharmacy [phone]

## 2021-10-19 NOTE — Assessment & Plan Note (Signed)
-   Continue home Synthroid °

## 2021-10-19 NOTE — Op Note (Signed)
Orthopaedic Surgery Operative Note (CSN: [id number] ) Date of Surgery: 10/19/2021  Admit Date: 10/18/2021   Diagnoses: Pre-Op Diagnoses: Left bicondylar tibial plateau fracture  Post-Op Diagnosis: Same  Procedures: CPT 27536-Open reduction internal fixation of left bicondylar tibial plateau fracture  Surgeons : Primary: Roby Lofts, MD  Assistant: Ulyses Southward, PA-C  Location: OR 3   Anesthesia:Spinal   Antibiotics: Ancef 2g preop with 1 gm vancomycin powder placed topically   Tourniquet time:None used    Estimated Blood Loss:20 mL  Complications:None  Specimens:None   Implants: Implant Name Type Inv. Item Serial No. Manufacturer Lot No. LRB No. Used Action  SCREW CORTEX 3.5 SELF TAP - [id number] Screw SCREW CORTEX 3.5 SELF TAP  [employer]  Left 1 Implanted  PLATE PROX TIBIA VA-LCP - [id number] Plate PLATE PROX TIBIA VA-LCP  [employer]  Left 1 Implanted  SCREW CORTEX 3.5 - [id number] Screw SCREW CORTEX 3.5  [employer]TH[employer]  Left 1 Implanted  SCREW LOCKING 3.5X26 - [id number] Screw SCREW LOCKING 3.5X26  [employer] ORTH[employer]  Left 2 Implanted  SCREW LOCKING VA 3.5X26MM - [id number] Screw SCREW LOCKING VA 3.5X26MM  [employer]  Left 1 Implanted  SCREW VA-LOCKING 3.5 - [id number] Screw SCREW VA-LOCKING 3.5  DEPU[employer]TH[employer]  Left 1 Implanted  SCREW LOCKING 3.5X70MM VA - [id number] Screw SCREW LOCKING 3.5X70MM VA  [employer]  Left 1 Implanted  SCREW 3.5X60 - [id number] Screw SCREW 3.5X60  DEPU[employer] ORTH[employer]  Left 1 Implanted  SCREW LOCKING VA 3.5X50MM - [id number] Screw SCREW LOCKING VA 3.5X50MM  [employer]PU[employer] ORTH[employer]  Left 1 Implanted     Indications for Surgery: 88 year old female who sustained a ground-level fall and a proximal tibia fracture.  Due to the unstable nature of her injury I recommend proceeding with open reduction internal fixation.  Risks and benefits were discussed with the patient and her daughter.   Risks include but not limited to bleeding, infection, malunion, nonunion, hardware failure, hardware irritation, nerve or blood vessel injury, DVT, even the possibility anesthetic complications.  They agreed to proceed with surgery and consent was obtained.  Operative Findings: Open reduction internal fixation of left bicondylar tibial plateau fracture using Synthes VA 3.5 mm proximal tibial locking plate.  Procedure: The patient was identified in the preoperative holding area. Consent was confirmed with the patient and their family and all questions were answered. The operative extremity was marked after confirmation with the patient. she was then brought back to the operating room by our anesthesia colleagues.  She was placed under spinal anesthetic and carefully transferred over to a radiolucent flat top table.  A bump was placed under her operative hip.  Left lower extremity was then prepped and draped in usual sterile fashion.  A timeout was performed to verify the patient, the procedure, and the extremity.  Preoperative antibiotics were dosed.  Fluoroscopic imaging showed the unstable nature of her injury.  A small minimally invasive incision was made at the proximal tibia was carried down through skin and subcutaneous tissue.  I then split the IT band in line with my incision and expose the lateral condyle enough to allow for the footprint of a proximal tibial plate.  I then chose a 10 hole VA 3.5 mm Synthes proximal tibial locking plate and slid this submuscularly along the lateral cortex of the tibia.  Reduction was performed by gentle traction by my assistant.  I held the position of the plate proximally with a 1.6 mm K wire.  I then placed a 3.5 mm nonlocking screw to bring the plate flush to bone.  I then percutaneously placed a 3.5 millimeter screw to bring the distal portion of plate to the tibial shaft.  Which I was pleased with the overall alignment and reduction I then proceeded to place 3  locking screws distal to the nonlocking screw in the tibial shaft.  I then placed 4 locking screws in the proximal segment with a kickstand screw to reinforce the medial calcar.  Final fluoroscopic imaging was obtained.  The incision was copiously irrigated.  A gram of vancomycin powder was placed into the incision.  A layered closure of 0 Vicryl for the IT band, 2-0 Vicryl and 3-0 nylon was used to close the skin.  Sterile dressings were applied.  Patient was then awoken from anesthesia and taken to the PACU in stable condition.  Post Op Plan/Instructions: The patient will be weightbearing as tolerated for transfers.  I would avoid walker ambulation for the first 4 weeks.  Unrestricted range of motion.  Knee immobilizer for comfort.  Plan for aspirin 81 to 325 mg twice daily for DVT prophylaxis.  We will have her mobilize with physical and Occupational Therapy.  I was present and performed the entire surgery.  Ulyses Southward, PA-C did assist me throughout the case. An assistant was necessary given the difficulty in approach, maintenance of reduction and ability to instrument the fracture.   Truitt Merle, MD [facility]

## 2021-10-19 NOTE — Hospital Course (Signed)
Claire Mccarthy is a 88 y.o. female with medical history significant of hypertension, hyperlipidemia hypothyroidism, depression, GERD, CVA who presents after a fall.  Found to have tib/fib fx.  Went to the OR on 1/27

## 2021-10-19 NOTE — TOC Progression Note (Signed)
Transition of Care [facility]) - Progression Note    Patient Details  Name: Claire Mccarthy MRN: [id number] Date of Birth: 08-27-1925  Transition [facility] Care [facility]) CM/SW Contact  Beckie Busing, RN Phone Number:[phone]  10/19/2021, 4:06 PM  Clinical Narrative:     Transition of Care [facility]) Screening Note   Patient Details  Name: Claire Mccarthy Date of Birth: 1925-04-11   Transition of Care [facility]) CM/SW Contact:    Beckie Busing, RN Phone Number: 10/19/2021, 4:06 PM    Transition of Care Department [facility]) has reviewed patient and no TOC needs have been identified at this time. We will continue to monitor patient advancement through interdisciplinary progression rounds. If new patient transition needs arise, please place a TOC consult.          Expected Discharge Plan and Services                                                 Social Determinants of Health (SDOH) Interventions    Readmission Risk Interventions No flowsheet data found.

## 2021-10-19 NOTE — Assessment & Plan Note (Signed)
-   Continue home atorvastatin 

## 2021-10-19 NOTE — Transfer of Care (Signed)
Immediate Anesthesia Transfer of Care Note  Patient: Claire Mccarthy  Procedure(s) Performed: OPEN REDUCTION INTERNAL FIXATION (ORIF) LEFT TIBIAL PLATEAU FRACTURE (Left: Leg Lower)  Patient Location: PACU  Anesthesia Type:MAC and Spinal  Level of Consciousness: awake, alert , oriented and patient cooperative  Airway & Oxygen Therapy: Patient Spontanous Breathing  Post-op Assessment: Report given to RN and Post -op Vital signs reviewed and stable  Post vital signs: Reviewed and stable  Last Vitals:  Vitals Value Taken Time  BP 66/39 10/19/21 1409  Temp    Pulse 59 10/19/21 1410  Resp 15 10/19/21 1410  SpO2 94 % 10/19/21 1410  Vitals shown include unvalidated device data.  Last Pain:  Vitals:   10/19/21 1147  TempSrc:   PainSc: 0-No pain      Patients Stated Pain Goal: 0 (36/62/94 7654)  Complications: No notable events documented.

## 2021-10-19 NOTE — Anesthesia Procedure Notes (Signed)
Spinal  Patient location during procedure: OR Start time: 10/19/2021 12:51 PM End time: 10/19/2021 12:53 PM Staffing Performed: anesthesiologist  Anesthesiologist: Atilano Median, DO Preanesthetic Checklist Completed: patient identified, IV checked, site marked, risks and benefits discussed, surgical consent, monitors and equipment checked, pre-op evaluation and timeout performed Spinal Block Patient position: sitting Prep: DuraPrep Patient monitoring: heart rate, cardiac monitor, continuous pulse ox and blood pressure Approach: left paramedian Location: L4-5 Injection technique: single-shot Needle Needle type: Pencan  Needle gauge: 24 G Needle length: 10 cm Assessment Events: CSF return Additional Notes Patient identified. Risks/Benefits/Options discussed with patient including but not limited to bleeding, infection, nerve damage, paralysis, failed block, incomplete pain control, headache, blood pressure changes, nausea, vomiting, reactions to medications, itching and postpartum back pain. Confirmed with bedside nurse the patient's most recent platelet count. Confirmed with patient that they are not currently taking any anticoagulation, have any bleeding history or any family history of bleeding disorders. Patient expressed understanding and wished to proceed. All questions were answered. Sterile technique was used throughout the entire procedure. Please see nursing notes for vital signs. Warning signs of high block given to the patient including shortness of breath, tingling/numbness in hands, complete motor block, or any concerning symptoms with instructions to call for help. Patient was given instructions on fall risk and not to get out of bed. All questions and concerns addressed with instructions to call with any issues or inadequate analgesia.

## 2021-10-19 NOTE — Assessment & Plan Note (Addendum)
-   vit D levels low: supplement

## 2021-10-19 NOTE — Assessment & Plan Note (Signed)
Continue home amlodipine and carvedilol

## 2021-10-19 NOTE — Progress Notes (Signed)
°  Progress Note   Patient: Claire Mccarthy XBM:[id number] DOB: [birth year] DOA: 10/18/2021     1 DOS: the patient was seen and examined on 10/19/2021   Brief hospital course: AIESHA LELAND is a 88 y.o. female with medical history significant of hypertension, hyperlipidemia hypothyroidism, depression, GERD, CVA who presents after a fall.  Found to have tib/fib fx.  Went to the OR on 1/27     Assessment and Plan Closed fracture of left tibial plateau- (present on admission)  Presenting after fall with significant left knee pain.  Found to have tibial and fibular fracture on x-ray.  CT showing comminuted and displaced tibia-fibula fracture. > Orthopedic consulted at [facility] and recommendation to transfer to [facility] for trauma orthopedic surgeon evaluation given comminuted and displaced nature fracture. - Appreciate orthopedics recommendations:  Due to the unstable nature of her injury I recommend proceeding with open reduction internal fixation.  Osteoporosis- (present on admission) -check vit D levels  Hypothyroidism- (present on admission) - Continue home Synthroid  HTN (hypertension)- (present on admission) Continue home amlodipine and carvedilol  HLD (hyperlipidemia)- (present on admission) Continue home atorvastatin    Subjective: in the OR  Objective Vitals:   10/19/21 0316 10/19/21 0752 10/19/21 0958 10/19/21 1131  BP: 120/74  135/62 (!) 116/41  Pulse: 77  88 76  Resp: 17 20  19   Temp: 98 F (36.7 C)   97.6 F (36.4 C)  TempSrc: Oral   Oral  SpO2: 95%   95%     Data Reviewed: Labs ordered for the AM    Disposition: Status is: Inpatient  Remains inpatient appropriate because: in the OR      Time spent: 15 minutes  Author: , DO 10/19/2021 1:39 PM  For on call review [email or web address].

## 2021-10-19 NOTE — Progress Notes (Signed)
Pt returned to floor from OR noted to have moderate amount sanguineous drainage on lower posterior calf. PA Montez Morita paged verbal order to leave current dressing (ace wrap) and reinforce with abd pads and compression wrap. Dressing reinforced.

## 2021-10-19 NOTE — Assessment & Plan Note (Addendum)
Presenting after fall with significant left knee pain.  Found to have tibial and fibular fracture on x-ray.  CT showing comminuted and displaced tibia-fibula fracture. > Orthopedic consulted at [facility] and recommendation to transfer to [facility] for trauma orthopedic surgeon evaluation given comminuted and displaced nature fracture. S/p ORIF: Weight-bear as tolerated left leg for transfers only Unrestricted ROM L knee                No walker immobilization for the next 4 weeks Ice and elevate for swelling and pain control              Will get hinged knee brace and this is to be unlocked -PT/OT eval -pain control: scheduled tylenol, PRN oxy

## 2021-10-19 NOTE — Anesthesia Preprocedure Evaluation (Addendum)
Anesthesia Evaluation  Patient identified by MRN, date of birth, ID band Patient confused    Reviewed: Allergy & Precautions, NPO status , Patient's Chart, lab work & pertinent test results  Airway Mallampati: I  TM Distance: >3 FB Neck ROM: Full    Dental no notable dental hx.    Pulmonary neg pulmonary ROS, former smoker,    Pulmonary exam normal        Cardiovascular hypertension, Pt. on medications and Pt. on home beta blockers  Rhythm:Regular Rate:Normal     Neuro/Psych CVA (2016), No Residual Symptoms negative psych ROS   GI/Hepatic Neg liver ROS, GERD  Medicated,  Endo/Other  Hypothyroidism   Renal/GU negative Renal ROS  negative genitourinary   Musculoskeletal Left tibial plateau fx   Abdominal Normal abdominal exam  (+)   Peds  Hematology negative hematology ROS (+)   Anesthesia Other Findings   Reproductive/Obstetrics                            Anesthesia Physical Anesthesia Plan  ASA: 3  Anesthesia Plan: Spinal and MAC   Post-op Pain Management:    Induction: Intravenous  PONV Risk Score and Plan: 2 and Ondansetron, Dexamethasone, Treatment may vary due to age or medical condition and Propofol infusion  Airway Management Planned: Mask, Simple Face Mask, Natural Airway and Nasal Cannula  Additional Equipment: None  Intra-op Plan:   Post-operative Plan:   Informed Consent: I have reviewed the patients History and Physical, chart, labs and discussed the procedure including the risks, benefits and alternatives for the proposed anesthesia with the patient or authorized representative who has indicated his/her understanding and acceptance.     Dental advisory given and Consent reviewed with POA  Plan Discussed with: CRNA  Anesthesia Plan Comments: (Lab Results      Component                Value               Date                      WBC                      14.4  (H)            10/19/2021                HGB                      8.5 (L)             10/19/2021                HCT                      24.7 (L)            10/19/2021                MCV                      88.8                10/19/2021                PLT  284                 10/19/2021           Lab Results      Component                Value               Date                      NA                       135                 10/19/2021                K                        4.0                 10/19/2021                CO2                      22                  10/19/2021                GLUCOSE                  123 (H)             10/19/2021                BUN                      19                  10/19/2021                CREATININE               0.68                10/19/2021                CALCIUM                  8.2 (L)             10/19/2021                GFRNONAA                 >60                 10/19/2021          )       Anesthesia Quick Evaluation

## 2021-10-19 NOTE — Consult Note (Signed)
Orthopaedic Trauma Service (OTS) Consult   Patient ID: Claire Mccarthy MRN: [id number] DOB/AGE: 86/05/1925 86 y.o.   Reason for Consult:left proximal tibia and fibula fracture  Referring Physician:  Ross MarcusMatthew Crawford, MD (ortho at [facility])   HPI: Claire Mccarthy is an 88 y.o. female who sustained a fall on 10/17/2021 while trying to get dressed.  Patient had deformity and inability to bear weight on her left leg.  Patient was brought to [facility] where she was found to have a displaced left proximal tibia and fibula fracture.  She was seen and evaluated by orthopedics at [facility] who asserted that this was outside the scope of their practice and requested transfer to [facility] for definitive treatment by the orthopedic trauma service.  Patient was transferred late yesterday evening.  She was placed only in a knee immobilizer.  She was transferred to the progressive care unit on 4 N.  Given her medical history she was admitted to the medicine service.  She was initially admitted to the medical service at [facility] and then was transferred to the orthopedic service on arrival to [facility] but we did communicate with the medicine service here and they have accepted her as primary providers.  Patient seen and evaluated 4 N. progressive 3.  Daughters are at bedside.  Patient lives with her daughter as she does alternate months living with each one.  She uses a walker at baseline for ambulation.  She is mobile around the house but she does not like to go out at all other than for doctors appointments  Patient complains of pain only in her left leg.  She is very hard of hearing.  Denies injuries elsewhere Denies any numbness or tingling in her lower extremities Pain is relieved with rest and analgesics and exacerbated with movement Pain has not worsened since injury  Most recent DEXA I see on the epic system is from 2011 which  does show osteoporosis in her lumbar spine.  Past Medical History:  Diagnosis Date   CVA (cerebral vascular accident) (HCC)    Dependent on walker for ambulation    GERD (gastroesophageal reflux disease)    HLD (hyperlipidemia) 01/21/2015   Hypertension    Hypothyroidism 01/22/2015   Personal history of transient ischemic attack (TIA), and cerebral infarction without residual deficits 01/21/2015   Formatting of this note might be different from the original. Intracranial hemorrhage    Past Surgical History:  Procedure Laterality Date   ABDOMINAL HYSTERECTOMY      Family History  Problem Relation Age of Onset   Stroke Other    Hypertension Other     Social History:  reports that she has quit smoking. Her smoking use included cigarettes. She has never used smokeless tobacco. She reports that she does not drink alcohol and does not use drugs.  Allergies: No Known Allergies  Medications: I have reviewed the patient's current medications. Current Meds  Medication Sig   acetaminophen (TYLENOL) 500 MG tablet Take 500 mg by mouth 2 (two) times daily as needed for mild pain, fever or headache.   amLODipine (NORVASC) 5 MG tablet Take 5 mg by mouth daily.   aspirin EC 81 MG tablet Take 81 mg by mouth daily. Swallow whole.   atorvastatin (LIPITOR) 20 MG tablet Take 20 mg by mouth daily.   bismuth subsalicylate (PEPTO BISMOL) 262 MG chewable tablet Chew 524 mg by mouth 4 (four) times daily  as needed for indigestion.   buPROPion (WELLBUTRIN) 75 MG tablet Take 75 mg by mouth daily.   carvedilol (COREG) 6.25 MG tablet Take 6.25 mg by mouth 2 (two) times daily.   dicyclomine (BENTYL) 20 MG tablet Take 20 mg by mouth daily.   HYDROcodone-acetaminophen (NORCO/VICODIN) 5-325 MG tablet Take 1 tablet by mouth 2 (two) times daily as needed for pain.   levothyroxine (SYNTHROID) 50 MCG tablet Take 50 mcg by mouth daily.     Results for orders placed or performed during the hospital encounter of  10/18/21 (from the past 48 hour(s))  Urinalysis, Complete w Microscopic Urine, Clean Catch     Status: Abnormal   Collection Time: 10/19/21  1:32 AM  Result Value Ref Range   Color, Urine YELLOW YELLOW   APPearance CLEAR CLEAR   Specific Gravity, Urine 1.013 1.005 - 1.030   pH 7.0 5.0 - 8.0   Glucose, UA NEGATIVE NEGATIVE mg/dL   Hgb urine dipstick SMALL (A) NEGATIVE   Bilirubin Urine NEGATIVE NEGATIVE   Ketones, ur NEGATIVE NEGATIVE mg/dL   Protein, ur NEGATIVE NEGATIVE mg/dL   Nitrite NEGATIVE NEGATIVE   Leukocytes,Ua MODERATE (A) NEGATIVE   RBC / HPF 6-10 0 - 5 RBC/hpf   WBC, UA >50 (H) 0 - 5 WBC/hpf   Bacteria, UA NONE SEEN NONE SEEN   Squamous Epithelial / LPF 0-5 0 - 5    Comment: Performed at [facility], [address] [place], [place] [address]  CBC     Status: Abnormal   Collection Time: 10/19/21  4:28 AM  Result Value Ref Range   WBC 14.4 (H) 4.0 - 10.5 K/uL   RBC 2.78 (L) 3.87 - 5.11 MIL/uL   Hemoglobin 8.5 (L) 12.0 - 15.0 g/dL   HCT 60.424.7 (L) 54.036.0 - 98.146.0 %   MCV 88.8 80.0 - 100.0 fL   MCH 30.6 26.0 - 34.0 pg   MCHC 34.4 30.0 - 36.0 g/dL   RDW 19.114.5 47.811.5 - 29.515.5 %   Platelets 284 150 - 400 K/uL   nRBC 0.0 0.0 - 0.2 %    Comment: Performed at [facility], [address] [place], [place] [address]  Basic metabolic panel     Status: Abnormal   Collection Time: 10/19/21  4:28 AM  Result Value Ref Range   Sodium 135 135 - 145 mmol/L   Potassium 4.0 3.5 - 5.1 mmol/L   Chloride 108 98 - 111 mmol/L   CO2 22 22 - 32 mmol/L   Glucose, Bld 123 (H) 70 - 99 mg/dL    Comment: Glucose reference range applies only to samples taken after fasting for at least 8 hours.   BUN 19 8 - 23 mg/dL   Creatinine, Ser 8.650.68 0.44 - 1.00 mg/dL   Calcium 8.2 (L) 8.9 - 10.3 mg/dL   GFR, Estimated >78>60 >46>60 mL/min    Comment: (NOTE) Calculated using the CKD-EPI Creatinine Equation (2021)    Anion gap 5 5 - 15    Comment: Performed at [facility], [address]  [place], [place] [address]  Type and screen [facility]     Status: None   Collection Time: 10/19/21  4:31 AM  Result Value Ref Range   ABO/RH(D) O POS    Antibody Screen NEG    Sample Expiration      10/22/2021,2359 Performed at [facility], [address] [place], [place] [address]   ABO/Rh  Status: None   Collection Time: 10/19/21  5:01 AM  Result Value Ref Range   ABO/RH(D)      O POS Performed at [facility], [address], [place], [place] [address]     CT Knee Left Wo Contrast  Result Date: 10/18/2021 CLINICAL DATA:  Fracture. EXAM: CT OF THE left KNEE WITHOUT CONTRAST TECHNIQUE: Multidetector CT imaging of the left knee was performed according to the standard protocol. Multiplanar CT image reconstructions were also generated. RADIATION DOSE REDUCTION: This exam was performed according to the departmental dose-optimization program which includes automated exposure control, adjustment of the mA and/or kV according to patient size and/or use of iterative reconstruction technique. COMPARISON:  Earlier radiograph dated 10/17/2021. FINDINGS: Bones/Joint/Cartilage There is a comminuted minimally displaced and impacted fracture of the fibular head and neck. There is an oblique and comminuted fracture of the proximal tibial metadiaphysis. There is approximately 5 mm lateral displacement of the distal fracture fragment and mild lateral angulation. There is severe osteopenia which limits evaluation for fracture. No definite dislocation. There is a small joint effusion. Ligaments Suboptimally assessed by CT. Muscles and Tendons No intramuscular hematoma. Soft tissues Advanced atherosclerotic calcification. Diffuse subcutaneous edema. No fluid collection. IMPRESSION: 1. Comminuted and displaced fractures of the proximal tibia and fibula. 2. Small joint effusion. Electronically Signed   By: Elgie Collard M.D.   On: 10/18/2021 02:36   Chest Portable 1 View  Result  Date: 10/18/2021 CLINICAL DATA:  Leukocytosis. EXAM: PORTABLE CHEST 1 VIEW COMPARISON:  Chest x-ray 06/03/2013. FINDINGS: Patient is rotated. Cardiomediastinal silhouette is grossly within normal limits. There is no lung consolidation, pleural effusion or pneumothorax. No acute fractures are seen. IMPRESSION: 1. Technically limited study. 2. No definite acute cardiopulmonary process. Electronically Signed   By: Darliss Cheney M.D.   On: 10/18/2021 23:13   DG Knee Complete 4 Views Left  Result Date: 10/17/2021 CLINICAL DATA:  Fall and trauma to the left knee. EXAM: LEFT KNEE - COMPLETE 4+ VIEW COMPARISON:  None. FINDINGS: Evaluation is limited due to advanced osteopenia and by positioning. There is a mildly displaced fracture of the fibular neck. There is a oblique fracture of the proximal tibial metadiaphysis with lateral displacement of the distal fracture fragment. Possible rotation of the knee or partial subluxation. No significant joint effusion. The soft tissue swelling of the knee. IMPRESSION: 1. Mildly displaced fracture of the fibular neck and displaced oblique fracture of the proximal tibial metadiaphysis. 2. Possible rotation or partial subluxation of the knee. Electronically Signed   By: Elgie Collard M.D.   On: 10/17/2021 23:19    Intake/Output      01/26 0701 01/27 0700 01/27 0701 01/28 0700   Urine 300    Total Output 300    Net -300         Urine Occurrence 1 x 1 x   Stool Occurrence  1 x      Review of Systems  Constitutional:  Negative for chills and fever.  Respiratory:  Negative for shortness of breath and wheezing.   Cardiovascular:  Negative for chest pain and palpitations.  Gastrointestinal:  Negative for nausea and vomiting.  Neurological:  Negative for tingling.  Blood pressure 135/62, pulse 88, temperature 98 F (36.7 C), temperature source Oral, resp. rate 20, SpO2 95 %. Physical Exam Vitals and nursing note reviewed.  Constitutional:      General: She is not  in acute distress.    Appearance: Normal appearance.  Comments: Hard of hearing Pleasant Moderate sarcopenia   HENT:     Head: Normocephalic and atraumatic.  Cardiovascular:     Heart sounds: S1 normal and S2 normal.  Pulmonary:     Effort: Pulmonary effort is normal. No accessory muscle usage.  Abdominal:     General: Abdomen is flat. Bowel sounds are normal.  Musculoskeletal:     Comments: Left Lower Extremity  Knee immobilizer in place, lots of redundant material  Immobilizer opened, extensive swelling and ecchymosis to L lower leg  Compartments are soft No pain out of proportion with passive stretching  Compartments are full but soft L leg much larger than contra-lateral side  Skin/soft tissue is very friable  No open wounds DPN, SPN, TN sensation intact EHL, FHL, lesser toe motor intact  Ankle flexion, extension, inversion and eversion intact No DCT  Ankle nontender Hip nontender  + DP pulse Ext warm   Right Lower Extremity              Abrasion R knee             no swelling or ecchymosis   Nontender hip, knee, ankle and foot             No crepitus or gross motion noted with manipulation of the right leg  No knee or ankle effusion             No pain with axial loading or logrolling of the hip. Negative Stinchfield test   Knee stable to varus/ valgus and anterior/posterior stress             No pain with manipulation of the ankle or foot             No blocks to motion noted  Sens DPN, SPN, TN intact  Motor EHL, FHL, lesser toe motor, Ext, flex, evers 5/5  DP 2+, No significant edema             Compartments are soft and nontender, no pain with passive stretching       Skin:    General: Skin is warm.     Capillary Refill: Capillary refill takes less than 2 seconds.  Neurological:     Mental Status: She is alert and oriented to person, place, and time.  Psychiatric:        Behavior: Behavior is cooperative.   Ortho Exam    Assessment/Plan:  88  y/o female s/p ground level fall with left proximal tibia and fibula fracture  -fall  - fragility fracture L proximal tibia and fibula   OR for ORIF L proximal tibia   WB restrictions to be determined post op  Unrestricted ROM L knee post op   Ice and elevate for swelling and pain control   Would benefit from compressive wrap post op as well    Therapy evals post op    Family would like snf as both daughters work    Looking for something in the [place] area  - Pain management:  Multimodal  Minimize narcotics  - ABL anemia/Hemodynamics  Monitor   - Medical issues   Per primary   - DVT/PE prophylaxis:  TBD post op - ID:   Periop abx  - Metabolic Bone Disease:  + fragility fracture  History of osteoporosis dating back to 2011   Check vitamin d   - Activity:  NWB L leg   - FEN/GI prophylaxis/Foley/Lines:  NPO   - Impediments to fracture healing:  Osteoporosis/poor bone quality   Age   - Dispo:  OR today for ORIF L proximal tibia      Mearl Latin, PA-C [phone] (C) 10/19/2021, 10:44 AM  [facility] [address] [place] [address] [phone] Val [phone] (F)    After 5pm and on the weekends please log on to Amion, go to orthopaedics and the look under the [facility] Call for the provider(s) on call. You can also call our office at [phone] and then follow the prompts to be connected to the call team.

## 2021-10-20 LAB — BASIC METABOLIC PANEL
Anion gap: 8 (ref 5–15)
BUN: 20 mg/dL (ref 8–23)
CO2: 23 mmol/L (ref 22–32)
Calcium: 8.2 mg/dL — ABNORMAL LOW (ref 8.9–10.3)
Chloride: 104 mmol/L (ref 98–111)
Creatinine, Ser: 0.62 mg/dL (ref 0.44–1.00)
GFR, Estimated: >60 mL/min (ref >60)
Glucose, Bld: 116 mg/dL — ABNORMAL HIGH (ref 70–99)
Potassium: 4 mmol/L (ref 3.5–5.1)
Sodium: 135 mmol/L (ref 135–145)

## 2021-10-20 LAB — CBC
HCT: 25.3 % — ABNORMAL LOW (ref 36.0–46.0)
Hemoglobin: 8.4 g/dL — ABNORMAL LOW (ref 12.0–15.0)
MCH: 29.8 pg (ref 26.0–34.0)
MCHC: 33.2 g/dL (ref 30.0–36.0)
MCV: 89.7 fL (ref 80.0–100.0)
Platelets: 303 10*3/uL (ref 150–400)
RBC: 2.82 MIL/uL — ABNORMAL LOW (ref 3.87–5.11)
RDW: 14.4 % (ref 11.5–15.5)
WBC: 13.8 10*3/uL — ABNORMAL HIGH (ref 4.0–10.5)
nRBC: 0 % (ref 0.0–0.2)

## 2021-10-20 LAB — VITAMIN D 25 HYDROXY (VIT D DEFICIENCY, FRACTURES): Vit D, 25-Hydroxy: 20.41 ng/mL — ABNORMAL LOW (ref 30–100)

## 2021-10-20 MED ORDER — VITAMIN D 25 MCG (1000 UNIT) PO TABS
2000.0000 [IU] | ORAL_TABLET | Freq: Two times a day (BID) | ORAL | Status: DC
  Administered 2021-10-20 – 2021-10-23 (×7): 2000 [IU] via ORAL
  Filled 2021-10-20 (×7): qty 2

## 2021-10-20 MED ORDER — OXYCODONE HCL 5 MG PO TABS
2.5000 mg | ORAL_TABLET | ORAL | Status: DC | PRN
  Administered 2021-10-20 – 2021-10-21 (×5): 5 mg via ORAL
  Filled 2021-10-20 (×5): qty 1

## 2021-10-20 MED ORDER — ASCORBIC ACID 500 MG PO TABS
500.0000 mg | ORAL_TABLET | Freq: Every day | ORAL | Status: DC
  Administered 2021-10-20 – 2021-10-23 (×4): 500 mg via ORAL
  Filled 2021-10-20 (×4): qty 1

## 2021-10-20 MED ORDER — ENOXAPARIN SODIUM 30 MG/0.3ML IJ SOSY
30.0000 mg | PREFILLED_SYRINGE | INTRAMUSCULAR | Status: DC
  Administered 2021-10-20 – 2021-10-22 (×3): 30 mg via SUBCUTANEOUS
  Filled 2021-10-20 (×3): qty 0.3

## 2021-10-20 MED ORDER — KETOROLAC TROMETHAMINE 15 MG/ML IJ SOLN
7.5000 mg | INTRAMUSCULAR | Status: AC
  Administered 2021-10-20: 7.5 mg via INTRAVENOUS
  Filled 2021-10-20: qty 1

## 2021-10-20 MED ORDER — KETOROLAC TROMETHAMINE 15 MG/ML IJ SOLN: 7.5000 mg | Freq: Once | INTRAMUSCULAR | Status: DC

## 2021-10-20 MED ORDER — ACETAMINOPHEN 500 MG PO TABS
1000.0000 mg | ORAL_TABLET | Freq: Three times a day (TID) | ORAL | Status: DC
  Administered 2021-10-20 – 2021-10-23 (×10): 1000 mg via ORAL
  Filled 2021-10-20 (×11): qty 2

## 2021-10-20 NOTE — Progress Notes (Signed)
Orthopaedic Trauma Service Progress Note  Patient ID: Claire Mccarthy MRN: [id number] DOB/AGE: 04/18/1925 88 y.o.  Subjective:  C/o left knee pain  Was sleeping comfortably when I first arrived. When I woke her up to examine her she began to cry in pain   Very hard of hearing    ROS As above  Objective:   VITALS:   Vitals:   10/19/21 2305 10/20/21 0326 10/20/21 0827 10/20/21 0904  BP: 137/60 131/61 (!) 84/70 115/71  Pulse: 79 73 91 87  Resp: 16 14 19 20   Temp: 98.6 F (37 C) 98.4 F (36.9 C)    TempSrc: Oral Oral Oral   SpO2: 91% 92% 93%     Estimated body mass index is 21.64 kg/m as calculated from the following:   Height as of 10/17/21: 4\' 9"  (1.448 m).   Weight as of 10/17/21: 45.4 kg.   Intake/Output      01/27 0701 01/28 0700 01/28 0701 01/29 0700   I.V. 1045.4    IV Piggyback 120    Total Intake 1165.4    Urine 800    Stool 0    Blood 20    Total Output 820    Net +345.4         Urine Occurrence 2 x    Stool Occurrence 1 x      LABS  Results for orders placed or performed during the hospital encounter of 10/18/21 (from the past 24 hour(s))  MRSA Next Gen by PCR, Nasal     Status: None   Collection Time: 10/19/21  9:49 AM   Specimen: Nasal Mucosa; Nasal Swab  Result Value Ref Range   MRSA by PCR Next Gen NOT DETECTED NOT DETECTED  Basic metabolic panel     Status: Abnormal   Collection Time: 10/20/21  2:39 AM  Result Value Ref Range   Sodium 135 135 - 145 mmol/L   Potassium 4.0 3.5 - 5.1 mmol/L   Chloride 104 98 - 111 mmol/L   CO2 23 22 - 32 mmol/L   Glucose, Bld 116 (H) 70 - 99 mg/dL   BUN 20 8 - 23 mg/dL   Creatinine, Ser 10/21/21 0.44 - 1.00 mg/dL   Calcium 8.2 (L) 8.9 - 10.3 mg/dL   GFR, Estimated 10/22/21 6.59 mL/min   Anion gap 8 5 - 15  CBC     Status: Abnormal   Collection Time: 10/20/21  2:39 AM  Result Value Ref Range   WBC 13.8 (H) 4.0 - 10.5 K/uL   RBC 2.82 (L)  3.87 - 5.11 MIL/uL   Hemoglobin 8.4 (L) 12.0 - 15.0 g/dL   HCT >57 (L) 10/22/21 - 01.7 %   MCV 89.7 80.0 - 100.0 fL   MCH 29.8 26.0 - 34.0 pg   MCHC 33.2 30.0 - 36.0 g/dL   RDW 79.3 90.3 - 00.9 %   Platelets 303 150 - 400 K/uL   nRBC 0.0 0.0 - 0.2 %     Latest Reference Range & Units 10/20/21 02:39  Vitamin D, 25-Hydroxy 30 - 100 ng/mL 20.41 (L)  (L): Data is abnormally low PHYSICAL EXAM:   Gen: lying in bed, appears comfortable but complaining of pain  Lungs: unlabored Ext:       Left Lower Extremity   Knee immobilizer and Ace  wrap are in place  Foot and ankle are resting in equinus  Swelling stable to foot  Motor and sensory functions grossly intact  Extremity is warm  Good perfusion distally  No deep calf tenderness  Compartments are soft  Assessment/Plan: 1 Day Post-Op    Anti-infectives (From admission, onward)    Start     Dose/Rate Route Frequency Ordered Stop   10/19/21 2100  ceFAZolin (ANCEF) IVPB 2g/100 mL premix        2 g 200 mL/hr over 30 Minutes Intravenous Every 12 hours 10/19/21 1517 10/21/21 0859   10/19/21 1400  ceFAZolin (ANCEF) IVPB 2g/100 mL premix  Status:  Discontinued        2 g 200 mL/hr over 30 Minutes Intravenous To ShortStay Surgical 10/19/21 1107 10/19/21 1517   10/19/21 1314  vancomycin (VANCOCIN) powder  Status:  Discontinued          As needed 10/19/21 1315 10/19/21 1402     .  POD/HD#: 1  88 y/o female s/p ground level fall with left proximal tibia and fibula fracture   -fall   - fragility fracture L proximal tibia and fibula s/p ORIF                Weight-bear as tolerated left leg for transfers only               Unrestricted ROM L knee     No walker immobilization for the next 4 weeks                Ice and elevate for swelling and pain control                  Will get hinged knee brace and this is to be unlocked                 Therapy evals  PT- please teach HEP for Left knee ROM- AROM, PROM. No ROM restrictions.   Quad sets, SLR, LAQ, SAQ, heel slides, stretching, prone flexion and extension  Ankle theraband program, heel cord stretching, toe towel curls, etc  No pillows under bend of knee when at rest, ok to place under heel to help work on extension. Can also use zero knee bone foam if available  Hinged knee brace on at all times, unlocked.  Ok to work on [employer] without brace with therapist supervision.  Brace back on after ROM session if removed                   Family would like snf as both daughters work                              Looking for something in the [place] area   - Pain management:               Multimodal               Minimize narcotics   Will give a low-dose ketorolac x1 given creatinine clearance.  Change Norco to oxycodone   - ABL anemia/Hemodynamics               Monitor    - Medical issues                Per primary    - DVT/PE prophylaxis:               renally dosed lovenox   -  ID:                Periop abx   - Metabolic Bone Disease:               + fragility fracture               History of osteoporosis dating back to 2011                Vitamin D insufficiency   Supplement   - Activity:              Weight-bear as tolerated left leg for transfers   - FEN/GI prophylaxis/Foley/Lines:               reg diet    - Impediments to fracture healing:               Osteoporosis/poor bone quality                Age    - Dispo:               ortho issues addressed    Therapies      SNF when stable           Mearl Latin, PA-C [phone] (C) 10/20/2021, 9:10 AM  [facility] [address] [place] [address] [phone] Val [phone] (F)    After 5pm and on the weekends please log on to Amion, go to orthopaedics and the look under the [facility] Call for the provider(s) on call. You can also call our office at [phone] and then follow the prompts to be connected to the call team.   Patient ID: Claire Mccarthy, female   DOB: October 02, 1924, 88 y.o.   MRN: [id number]

## 2021-10-20 NOTE — Progress Notes (Signed)
Orthopedic Tech Progress Note Patient Details:  Claire Mccarthy 12-29-1924 [id number]  Ortho Devices Type of Ortho Device: Prafo boot/shoe Ortho Device/Splint Location: Left foot Ortho Device/Splint Interventions: Application   Post Interventions Patient Tolerated: Well  Genelle Bal Claire Mccarthy 10/20/2021, 12:17 PM

## 2021-10-20 NOTE — Progress Notes (Signed)
°  Progress Note   Patient: Claire Mccarthy [id number] DOB: [birth year] DOA: 10/18/2021     2 DOS: the patient was seen and examined on 10/20/2021   Brief hospital course: SHERIAN BIGNESS is a 88 y.o. female with medical history significant of hypertension, hyperlipidemia hypothyroidism, depression, GERD, CVA who presents after a fall.  Found to have tib/fib fx.  Went to the OR on 1/27     Assessment and Plan Closed fracture of left tibial plateau- (present on admission)  Presenting after fall with significant left knee pain.  Found to have tibial and fibular fracture on x-ray.  CT showing comminuted and displaced tibia-fibula fracture. > Orthopedic consulted at [facility] and recommendation to transfer to [facility] for trauma orthopedic surgeon evaluation given comminuted and displaced nature fracture. S/p ORIF: Weight-bear as tolerated left leg for transfers only               Unrestricted ROM L knee                No walker immobilization for the next 4 weeks               Ice and elevate for swelling and pain control              Will get hinged knee brace and this is to be unlocked -PT/OT eval -pain control: scheduled tylenol, PRN oxy  Osteoporosis- (present on admission) - vit D levels low: supplement  Hypothyroidism- (present on admission) - Continue home Synthroid  HTN (hypertension)- (present on admission) Continue home amlodipine and carvedilol  HLD (hyperlipidemia)- (present on admission) Continue home atorvastatin    Subjective: unable to get comfortable in the bed  Objective Vitals:   10/19/21 2305 10/20/21 0326 10/20/21 0827 10/20/21 0904  BP: 137/60 131/61 (!) 84/70 115/71  Pulse: 79 73 91 87  Resp: 16 14 19 20   Temp: 98.6 F (37 C) 98.4 F (36.9 C)    TempSrc: Oral Oral Oral   SpO2: 91% 92% 93% 93%     General: Appearance:    Well developed, well nourished female in no acute distress     Lungs:      respirations unlabored  Heart:    Normal heart rate.     MS:   All extremities are intact.    Neurologic:   Awake, alert     Data Reviewed: Hgb: 8.4 Low vit d Cr stable  Family Communication: at bedside  Disposition: Status is: Inpatient  Remains inpatient appropriate because: needs PT/OT eval and prob SNF       Time spent: 35 minutes  Author: Geradine Girt, DO 10/20/2021 1:43 PM  For on call review [email or web address].

## 2021-10-20 NOTE — Progress Notes (Signed)
Orthopedic Tech Progress Note Patient Details:  Claire Mccarthy 02-02-1925 [id number] ROM Knee Brace has been ordered  Patient ID: Claire Mccarthy, female   DOB: 12-20-1924, 88 y.o.   MRN: [id number]  Smitty Pluck 10/20/2021, 11:37 AM

## 2021-10-20 NOTE — Evaluation (Signed)
Physical Therapy Evaluation Patient Details Name: Claire Mccarthy MRN: [id number] DOB: [birth year] Today's Date: 10/20/2021  History of Present Illness  88 y/o female admitted 1/26 s/p ground level fall with left proximal tibia and fibula fracture.  ORIF 1/27.  Clinical Impression  Pt admitted with above diagnosis. Pt was able to sit EOB with min assist and needed max assist of 2 persons to transfer to chair. Given that pt will need to be wheelchair level for some time and daughters work, she will need SNF for rehab with eventual return to home. Pt currently with functional limitations due to the deficits listed below (see PT Problem List). Pt will benefit from skilled PT to increase their independence and safety with mobility to allow discharge to the venue listed below.          Recommendations for follow up therapy are one component of a multi-disciplinary discharge planning process, led by the attending physician.  Recommendations may be updated based on patient status, additional functional criteria and insurance authorization.  Follow Up Recommendations Skilled nursing-short term rehab (<3 hours/day)    Assistance Recommended at Discharge Frequent or constant Supervision/Assistance  Patient can return home with the following  Two people to help with walking and/or transfers;Two people to help with bathing/dressing/bathroom    Equipment Recommendations None recommended by PT  Recommendations for Other Services       Functional Status Assessment Patient has had a recent decline in their functional status and demonstrates the ability to make significant improvements in function in a reasonable and predictable amount of time.     Precautions / Restrictions Precautions Precautions: Fall Required Braces or Orthoses: Knee Immobilizer - Left;Other Brace (hinged (unlocked) brace ordered; remove with PT to do unrestricted ROM) Knee Immobilizer - Left: On at all times Restrictions Weight  Bearing Restrictions: Yes LLE Weight Bearing: Weight bearing as tolerated Other Position/Activity Restrictions: TRANSFERS  ONLY for 4 weeks      Mobility  Bed Mobility Overal bed mobility: Needs Assistance Bed Mobility: Supine to Sit     Supine to sit: Mod assist, +2 for physical assistance     General bed mobility comments: Needed assist for LEs and trunk    Transfers Overall transfer level: Needs assistance   Transfers: Bed to chair/wheelchair/BSC            Lateral/Scoot Transfers: Max assist, +2 physical assistance, From elevated surface General transfer comment: max assist to scoot to drop arm recliner to chair with use of pad.  Pt did not put weight on left LE    Ambulation/Gait               General Gait Details: Pt is transfers only per order  Stairs            Wheelchair Mobility    Modified Rankin (Stroke Patients Only)       Balance Overall balance assessment: Needs assistance Sitting-balance support: No upper extremity supported, Feet supported, Bilateral upper extremity supported Sitting balance-Leahy Scale: Poor Sitting balance - Comments: relies on UE support to sit EOB                                     Pertinent Vitals/Pain Pain Assessment Pain Assessment: Faces Faces Pain Scale: Hurts whole lot Pain Location: left LE Pain Descriptors / Indicators: Grimacing, Guarding, Aching Pain Intervention(s): Limited activity within patient's tolerance, Repositioned, Monitored during session, Premedicated before  session    Home Living Family/patient expects to be discharged to:: Skilled nursing facility                 Home Equipment: BSC/3in1;Rolling Walker (2 wheels);Wheelchair - manual (MD got them a wheelchair that is too big about 9 months) Additional Comments: Claris Che daughter present - month alternates with daughters    Prior Function Prior Level of Function : Independent/Modified Independent                      Hand Dominance        Extremity/Trunk Assessment   Upper Extremity Assessment Upper Extremity Assessment: Defer to OT evaluation    Lower Extremity Assessment Lower Extremity Assessment: LLE deficits/detail LLE: Unable to fully assess due to pain    Cervical / Trunk Assessment Cervical / Trunk Assessment: Kyphotic  Communication   Communication: HOH  Cognition Arousal/Alertness: Awake/alert Behavior During Therapy: WFL for tasks assessed/performed Overall Cognitive Status: Within Functional Limits for tasks assessed                                          General Comments General comments (skin integrity, edema, etc.): VSS    Exercises General Exercises - Lower Extremity Ankle Circles/Pumps: AAROM, Both, 5 reps, Supine Quad Sets: AAROM, Both, 5 reps, Supine   Assessment/Plan    PT Assessment Patient needs continued PT services  PT Problem List Decreased activity tolerance;Decreased balance;Decreased mobility;Decreased knowledge of use of DME;Decreased safety awareness;Decreased knowledge of precautions;Pain;Decreased strength;Decreased range of motion       PT Treatment Interventions DME instruction;Gait training;Functional mobility training;Therapeutic activities;Therapeutic exercise;Balance training;Patient/family education    PT Goals (Current goals can be found in the Care Plan section)  Acute Rehab PT Goals Patient Stated Goal: to go to rehab and then home PT Goal Formulation: With patient/family Time For Goal Achievement: 11/03/21 Potential to Achieve Goals: Good    Frequency Min 3X/week     Co-evaluation               AM-PAC PT "6 Clicks" Mobility  Outcome Measure Help needed turning from your back to your side while in a flat bed without using bedrails?: Total Help needed moving from lying on your back to sitting on the side of a flat bed without using bedrails?: Total Help needed moving to and from a bed  to a chair (including a wheelchair)?: Total Help needed standing up from a chair using your arms (e.g., wheelchair or bedside chair)?: Total Help needed to walk in hospital room?: Total Help needed climbing 3-5 steps with a railing? : Total 6 Click Score: 6    End of Session Equipment Utilized During Treatment: Gait belt Activity Tolerance: Patient limited by fatigue;Patient limited by pain Patient left: in chair;with call bell/phone within reach;with chair alarm set Nurse Communication: Mobility status (use of pad and scoot with arm of chair dropped) PT Visit Diagnosis: Unsteadiness on feet (R26.81);Muscle weakness (generalized) (M62.81);Pain Pain - Right/Left: Left Pain - part of body: Leg    Time: 8657-8469 PT Time Calculation (min) (ACUTE ONLY): 24 min   Charges:   PT Evaluation $PT Eval Moderate Complexity: 1 Mod PT Treatments $Therapeutic Activity: 8-22 mins        Kierra Jezewski M,PT Acute Rehab Services [phone] (pager)   Bevelyn Buckles 10/20/2021, 3:34 PM

## 2021-10-20 NOTE — Progress Notes (Signed)
OT Cancellation Note  Patient Details Name: Claire Mccarthy MRN: [id number] DOB: [birth year]   Cancelled Treatment:    Reason Eval/Treat Not Completed: Patient declined, recently put back to bed, OT to continue efforts next date.  Kalin Amrhein D Terion Hedman 10/20/2021, 2:35 PM

## 2021-10-21 DIAGNOSIS — S82142A Displaced bicondylar fracture of left tibia, initial encounter for closed fracture: Secondary | ICD-10-CM

## 2021-10-21 LAB — COMPREHENSIVE METABOLIC PANEL
ALT: 10 U/L (ref 0–44)
AST: 23 U/L (ref 15–41)
Albumin: 2.6 g/dL — ABNORMAL LOW (ref 3.5–5.0)
Alkaline Phosphatase: 42 U/L (ref 38–126)
Anion gap: 7 (ref 5–15)
BUN: 22 mg/dL (ref 8–23)
CO2: 22 mmol/L (ref 22–32)
Calcium: 7.6 mg/dL — ABNORMAL LOW (ref 8.9–10.3)
Chloride: 103 mmol/L (ref 98–111)
Creatinine, Ser: 0.81 mg/dL (ref 0.44–1.00)
GFR, Estimated: >60 mL/min (ref >60)
Glucose, Bld: 88 mg/dL (ref 70–99)
Potassium: 3.9 mmol/L (ref 3.5–5.1)
Sodium: 132 mmol/L — ABNORMAL LOW (ref 135–145)
Total Bilirubin: 0.6 mg/dL (ref 0.3–1.2)
Total Protein: 4.8 g/dL — ABNORMAL LOW (ref 6.5–8.1)

## 2021-10-21 LAB — CBC
HCT: 24.5 % — ABNORMAL LOW (ref 36.0–46.0)
Hemoglobin: 8 g/dL — ABNORMAL LOW (ref 12.0–15.0)
MCH: 29.5 pg (ref 26.0–34.0)
MCHC: 32.7 g/dL (ref 30.0–36.0)
MCV: 90.4 fL (ref 80.0–100.0)
Platelets: 298 10*3/uL (ref 150–400)
RBC: 2.71 MIL/uL — ABNORMAL LOW (ref 3.87–5.11)
RDW: 14.5 % (ref 11.5–15.5)
WBC: 14.8 10*3/uL — ABNORMAL HIGH (ref 4.0–10.5)
nRBC: 0 % (ref 0.0–0.2)

## 2021-10-21 MED ORDER — SENNA 8.6 MG PO TABS
1.0000 | ORAL_TABLET | Freq: Every day | ORAL | Status: DC
  Administered 2021-10-21: 8.6 mg via ORAL
  Filled 2021-10-21 (×2): qty 1

## 2021-10-21 MED ORDER — OXYCODONE HCL 5 MG PO TABS
5.0000 mg | ORAL_TABLET | ORAL | Status: DC | PRN
  Administered 2021-10-21 – 2021-10-23 (×8): 5 mg via ORAL
  Filled 2021-10-21 (×8): qty 1

## 2021-10-21 MED ORDER — ENSURE ENLIVE PO LIQD
237.0000 mL | Freq: Two times a day (BID) | ORAL | Status: DC
  Administered 2021-10-21 – 2021-10-23 (×4): 237 mL via ORAL

## 2021-10-21 MED ORDER — POLYETHYLENE GLYCOL 3350 17 G PO PACK
17.0000 g | PACK | Freq: Every day | ORAL | Status: DC
  Filled 2021-10-21 (×3): qty 1

## 2021-10-21 MED ORDER — ADULT MULTIVITAMIN W/MINERALS CH
1.0000 | ORAL_TABLET | Freq: Every day | ORAL | Status: DC
  Administered 2021-10-21 – 2021-10-23 (×3): 1 via ORAL
  Filled 2021-10-21 (×3): qty 1

## 2021-10-21 NOTE — Progress Notes (Signed)
Orthopaedic Trauma Service Progress Note  Patient ID: Claire Mccarthy MRN: [id number] DOB/AGE: 1925/03/04 88 y.o.  Subjective:  Improved pain control with changes yesterday  No acute issues   Speaking in german to her nurse   ROS As above  Objective:   VITALS:   Vitals:   10/20/21 1918 10/20/21 2334 10/21/21 0342 10/21/21 0740  BP: (!) 115/55 125/66 134/61 (!) 138/58  Pulse: 72 64 68 64  Resp: 16 11 16 14   Temp: 97.8 F (36.6 C) 98.2 F (36.8 C) 98.9 F (37.2 C)   TempSrc: Oral Oral Oral   SpO2: 97% 93% 94% 96%    Estimated body mass index is 21.64 kg/m as calculated from the following:   Height as of 10/17/21: 4\' 9"  (1.448 m).   Weight as of 10/17/21: 45.4 kg.   Intake/Output      01/28 0701 01/29 0700 01/29 0701 01/30 0700   P.O. 360    I.V.     IV Piggyback     Total Intake 360    Urine 400 650   Stool     Blood     Total Output 400 650   Net -40 -650        Urine Occurrence 1 x      LABS  Results for orders placed or performed during the hospital encounter of 10/18/21 (from the past 24 hour(s))  CBC     Status: Abnormal   Collection Time: 10/21/21  3:26 AM  Result Value Ref Range   WBC 14.8 (H) 4.0 - 10.5 K/uL   RBC 2.71 (L) 3.87 - 5.11 MIL/uL   Hemoglobin 8.0 (L) 12.0 - 15.0 g/dL   HCT 10/20/21 (L) 10/23/21 - 32.2 %   MCV 90.4 80.0 - 100.0 fL   MCH 29.5 26.0 - 34.0 pg   MCHC 32.7 30.0 - 36.0 g/dL   RDW 02.5 42.7 - 06.2 %   Platelets 298 150 - 400 K/uL   nRBC 0.0 0.0 - 0.2 %  Comprehensive metabolic panel     Status: Abnormal   Collection Time: 10/21/21  3:26 AM  Result Value Ref Range   Sodium 132 (L) 135 - 145 mmol/L   Potassium 3.9 3.5 - 5.1 mmol/L   Chloride 103 98 - 111 mmol/L   CO2 22 22 - 32 mmol/L   Glucose, Bld 88 70 - 99 mg/dL   BUN 22 8 - 23 mg/dL   Creatinine, Ser 28.3 0.44 - 1.00 mg/dL   Calcium 7.6 (L) 8.9 - 10.3 mg/dL   Total Protein 4.8 (L) 6.5 - 8.1  g/dL   Albumin 2.6 (L) 3.5 - 5.0 g/dL   AST 23 15 - 41 U/L   ALT 10 0 - 44 U/L   Alkaline Phosphatase 42 38 - 126 U/L   Total Bilirubin 0.6 0.3 - 1.2 mg/dL   GFR, Estimated 10/23/21 1.51 mL/min   Anion gap 7 5 - 15     PHYSICAL EXAM:   Gen: lying in bed, appears comfortable Lungs: unlabored Ext:       Left Lower Extremity              hinged knee brace fitting well, Ace wrap are in place             [facility] boot fitting  poorly    I removed prafo    Will do bone foam instead              Swelling stable to foot             Motor and sensory functions grossly intact             Extremity is warm             Good perfusion distally             No deep calf tenderness             Compartments are soft    Assessment/Plan: 2 Days Post-Op   Active Problems:   Closed fracture of left tibial plateau   HLD (hyperlipidemia)   HTN (hypertension)   Hypothyroidism   Personal history of transient ischemic attack (TIA), and cerebral infarction without residual deficits   Osteoporosis   Pathological fracture of ankle due to age-related osteoporosis   Anti-infectives (From admission, onward)    Start     Dose/Rate Route Frequency Ordered Stop   10/19/21 2100  ceFAZolin (ANCEF) IVPB 2g/100 mL premix        2 g 200 mL/hr over 30 Minutes Intravenous Every 12 hours 10/19/21 1517 10/20/21 2144   10/19/21 1400  ceFAZolin (ANCEF) IVPB 2g/100 mL premix  Status:  Discontinued        2 g 200 mL/hr over 30 Minutes Intravenous To [facility] 10/19/21 1107 10/19/21 1517   10/19/21 1314  vancomycin (VANCOCIN) powder  Status:  Discontinued          As needed 10/19/21 1315 10/19/21 1402     .  POD/HD#: 2  88 y/o female s/p ground level fall with left proximal tibia and fibula fracture   -fall   - fragility fracture L proximal tibia and fibula s/p ORIF                Weight-bear as tolerated left leg for transfers only               Unrestricted ROM L knee                No walker  immobilization for the next 4 weeks                 Ice and elevate for swelling and pain control                             Will get hinged knee brace and this is to be unlocked  Bone foam to float heal and keep knee in extension at rest                  Therapies    PT- please teach HEP for Left knee ROM- AROM, PROM. No ROM restrictions.  Quad sets, SLR, LAQ, SAQ, heel slides, stretching, prone flexion and extension   Ankle theraband program, heel cord stretching, toe towel curls, etc   No pillows under bend of knee when at rest, ok to place under heel to help work on extension. Can also use zero knee bone foam if available   Hinged knee brace on at all times, unlocked.  Ok to work on [employer] without brace with therapist supervision.  Brace back on after ROM session if removed  Family would like snf as both daughters work                              Looking for something in the [place] area   - Pain management:               Multimodal              tylenol    Oxy IR    - ABL anemia/Hemodynamics               Monitor    - Medical issues                Per primary    - DVT/PE prophylaxis:               renally dosed lovenox    - ID:                Periop abx completed    - Metabolic Bone Disease:               + fragility fracture               History of osteoporosis dating back to 2011                Vitamin D insufficiency                         Supplement   - Activity:              Weight-bear as tolerated left leg for transfers   - FEN/GI prophylaxis/Foley/Lines:               reg diet    - Impediments to fracture healing:               Osteoporosis/poor bone quality                Age    - Dispo:               ortho issues addressed               Therapies                 SNF once bed available   Mearl Latin, PA-C [phone] (C) 10/21/2021, 11:09 AM  [facility] [address] [place]  [address] [phone] [phone] (F)    After 5pm and on the weekends please log on to Amion, go to orthopaedics and the look under the [facility] Call for the provider(s) on call. You can also call our office at [phone] and then follow the prompts to be connected to the call team.   Patient ID: Claire Mccarthy, female   DOB: [birth year], 88 y.o.   MRN: [id number]

## 2021-10-21 NOTE — Progress Notes (Signed)
Initial Nutrition Assessment  DOCUMENTATION CODES:   Not applicable  INTERVENTION:   - Ensure Enlive po BID, each supplement provides 350 kcal and 20 grams of protein  - MVI with minerals daily  - Agree with Regular diet order  - Encourage PO intake and provide feeding assistance as needed  NUTRITION DIAGNOSIS:   Increased nutrient needs related to hip fracture, post-op healing as evidenced by estimated needs.  GOAL:   Patient will meet greater than or equal to 90% of their needs  MONITOR:   PO intake, Supplement acceptance, Labs, Weight trends  REASON FOR ASSESSMENT:   Consult Assessment of nutrition requirement/status  ASSESSMENT:   88 year old female who presented to [facility] ED on 1/25 after a mechanical fall. PMH of HTN, HLD, hypothyroidism, depression, GERD, CVA. Pt found to have L proximal tibia and fibula fracture.  01/26 - transferred to [facility] 01/27 - s/p ORIF L bicondylar tibial plateau fracture  RD working remotely. Unable to reach pt via phone call to room. Per notes, pt is very hard of hearing. Will attempt to obtain diet and weight history at follow-up.  Pt currently on a Regular diet with no meal completions charted at this time. No weight history available in chart. Last available weight is from 10/17/21.  RD to order oral nutrition supplements to aid pt in meeting increased kcal and protein needs. Will also order daily MVI with minerals. Pt currently on vitamin C supplement and cholecalciferol supplement due to low vitamin D levels. Recommend continuing these.  Medications reviewed and include: vitamin C 500 mg daily, cholecalciferol 2000 units BID, colace, protonix  Labs reviewed: sodium 132, vitamin D 20.41, hemoglobin 8.0, WBC 14.8  NUTRITION - FOCUSED PHYSICAL EXAM:  Unable to complete at this time. RD working remotely.  Diet Order:   Diet Order             Diet regular Room service appropriate? Yes; Fluid consistency: Thin  Diet effective  now                   EDUCATION NEEDS:   Not appropriate for education at this time  Skin:  Skin Assessment: Skin Integrity Issues: Incisions: closed incision L leg  Last BM:  10/19/21 large type 5  Height:   Ht Readings from Last 1 Encounters:  10/17/21 4\' 9"  (1.448 m)    Weight:   Wt Readings from Last 1 Encounters:  10/17/21 45.4 kg    BMI:  21.64 (calculated using height and weight from 1/25)  Estimated Nutritional Needs:   Kcal:  1300-1500  Protein:  60-75 grams  Fluid:  1.3-1.5 L    2/25, MS, RD, LDN Inpatient Clinical Dietitian Please see AMiON for contact information.

## 2021-10-21 NOTE — TOC Initial Note (Signed)
Transition of Care [facility]) - Initial/Assessment Note    Patient Details  Name: Claire Mccarthy MRN: [id number] Date of Birth: Jun 21, 1925  Transition of Care [facility]) CM/SW Contact:    Alfredia Ferguson, LCSW Phone Number: 10/21/2021, 2:45 PM  Clinical Narrative:                 CSW contacted by MD regarding SNF recommendation by PT. CSW met with patient and daughter and discussed recommendation for SNF and provided information on what to expect. Family and patient were amenable to SNF referral and expressed strong recommendation for [place] area and preference for [facility] or [facility]. CSW notes this is reasonable and will follow up with SNF referrals. TOC will touch base when bed offers are made and will continue to follow otherwise.   Expected Discharge Plan: [place] Barriers to Discharge: SNF Pending bed offer   Patient Goals and CMS Choice Patient states their goals for this hospitalization and ongoing recovery are:: "If the doctor says rehab I'll go." CMS Medicare.gov Compare Post Acute Care list provided to:: Patient Choice offered to / list presented to : Patient, Adult Children  Expected Discharge Plan and Services Expected Discharge Plan: [place] Choice: [place] Living arrangements for the past 2 months: Single Family Home                                      Prior Living Arrangements/Services Living arrangements for the past 2 months: Single Family Home   Patient language and need for interpreter reviewed:: Yes Do you feel safe going back to the place where you live?: Yes      Need for Family Participation in Patient Care: No (Comment) Care giver support system in place?: Yes (comment)   Criminal Activity/Legal Involvement Pertinent to Current Situation/Hospitalization: No - Comment as needed  Activities of Daily Living   ADL Screening (condition at time of  admission) Patient's cognitive ability adequate to safely complete daily activities?: Yes Is the patient deaf or have difficulty hearing?: Yes Does the patient have difficulty seeing, even when wearing glasses/contacts?: No Does the patient have difficulty concentrating, remembering, or making decisions?: No Patient able to express need for assistance with ADLs?: Yes  Permission Sought/Granted Permission sought to share information with : Facility Art therapist granted to share information with : Yes, Verbal Permission Granted  Share Information with NAME: SNF facilities in [facility] for referrals           Emotional Assessment Appearance:: Appears stated age Attitude/Demeanor/Rapport: Self-Confident Affect (typically observed): Calm Orientation: : Oriented to Self, Oriented to Place, Oriented to  Time, Oriented to Situation Alcohol / Substance Use: Not Applicable Psych Involvement: No (comment)  Admission diagnosis:  Tibia/fibula fracture [S82.209A, S82.409A] Patient Active Problem List   Diagnosis Date Noted   Osteoporosis 10/19/2021   Pathological fracture of ankle due to age-related osteoporosis 10/19/2021   Closed fracture of left tibial plateau 10/18/2021   Tibia/fibula fracture 10/18/2021   Hypothyroidism 01/22/2015   HLD (hyperlipidemia) 01/21/2015   HTN (hypertension) 01/21/2015   Personal history of transient ischemic attack (TIA), and cerebral infarction without residual deficits 01/21/2015   PCP:  [facility] [place]:   [address] [place] [address] Phone:  [phone] Fax: [phone]     Social Determinants of Health (SDOH) Interventions    Readmission Risk Interventions No flowsheet data found.

## 2021-10-21 NOTE — Progress Notes (Signed)
°  Progress Note   Patient: Claire Mccarthy KDX:[id number] DOB: 1924-10-28 DOA: 10/18/2021     3 DOS: the patient was seen and examined on 10/21/2021   Brief hospital course: Claire Mccarthy is a 88 y.o. female with medical history significant of hypertension, hyperlipidemia hypothyroidism, depression, GERD, CVA who presents after a fall.  Found to have tib/fib fx.  Went to the OR on 1/27    Assessment and Plan Osteoporosis- (present on admission) - vit D levels low: supplement  Hypothyroidism- (present on admission) - Continue home Synthroid  HTN (hypertension)- (present on admission) Continue home amlodipine and carvedilol  HLD (hyperlipidemia)- (present on admission) Continue home atorvastatin  Closed fracture of left tibial plateau- (present on admission)  Presenting after fall with significant left knee pain.  Found to have tibial and fibular fracture on x-ray.  CT showing comminuted and displaced tibia-fibula fracture. > Orthopedic consulted at [facility] and recommendation to transfer to [facility] for trauma orthopedic surgeon evaluation given comminuted and displaced nature fracture. S/p ORIF on 1/27: Weight-bear as tolerated left leg for transfers only               Unrestricted ROM L knee                No walker immobilization for the next 4 weeks               Ice and elevate for swelling and pain control              Will get hinged knee brace and this is to be unlocked -PT/OT eval advising snf. TOC aware and has started bed search -pain control: scheduled tylenol, PRN oxy    Subjective: mild leg pain. Tolerating diet.  Objective Vitals:   10/20/21 1918 10/20/21 2334 10/21/21 0342 10/21/21 0740  BP: (!) 115/55 125/66 134/61 (!) 138/58  Pulse: 72 64 68 64  Resp: 16 11 16 14   Temp: 97.8 F (36.6 C) 98.2 F (36.8 C) 98.9 F (37.2 C)   TempSrc: Oral Oral Oral   SpO2: 97% 93% 94% 96%     General: Appearance:    Well developed, well nourished female in no acute  distress     Lungs:      respirations unlabored  Heart: Ext:    Normal heart rate.  warm   MS:   All extremities are intact.    Neurologic:   Awake, alert       Family Communication: at bedside  Disposition: Status is: Inpatient  Remains inpatient appropriate because: unsafe d/c plan    Time spent: 35 minutes  Author: , MD 10/21/2021 1:24 PM  For on call review [email or web address].

## 2021-10-21 NOTE — NC FL2 (Signed)
[place] MEDICAID FL2 LEVEL OF CARE SCREENING TOOL     IDENTIFICATION  Patient Name: Claire Mccarthy Birthdate: Jun 17, 1925 Sex: female Admission Date (Current Location): 10/18/2021  [facility] and [place] Number:  Producer, television/film/video and Address:  The . [facility], [address] [place], [place] [address] Patrcia Dolly)      Provider Number: [id number]  Attending Physician Name and Address:  Kathrynn Running, MD  Relative Name and Phone Number:  Deniece Ree, daughter, [phone]    Current Level of Care: Hospital Recommended Level of Care: Skilled Nursing Facility Prior Approval Number:    Date Approved/Denied:   PASRR Number: [id number]  Discharge Plan: SNF    Current Diagnoses: Patient Active Problem List   Diagnosis Date Noted   Osteoporosis 10/19/2021   Pathological fracture of ankle due to age-related osteoporosis 10/19/2021   Closed fracture of left tibial plateau 10/18/2021   Tibia/fibula fracture 10/18/2021   Hypothyroidism 01/22/2015   HLD (hyperlipidemia) 01/21/2015   HTN (hypertension) 01/21/2015   Personal history of transient ischemic attack (TIA), and cerebral infarction without residual deficits 01/21/2015    Orientation RESPIRATION BLADDER Height & Weight     Self, Time, Situation, Place  O2 (nasaul cannula 2L/M) Continent Weight:   Height:     BEHAVIORAL SYMPTOMS/MOOD NEUROLOGICAL BOWEL NUTRITION STATUS      Continent Diet (Fluid thin)  AMBULATORY STATUS COMMUNICATION OF NEEDS Skin   Extensive Assist Verbally Normal                       Personal Care Assistance Level of Assistance  Bathing, Feeding, Dressing Bathing Assistance: Maximum assistance Feeding assistance: Limited assistance Dressing Assistance: Maximum assistance     Functional Limitations Info  Hearing   Hearing Info: Impaired      SPECIAL CARE FACTORS FREQUENCY  PT (By licensed PT), OT (By licensed OT)     PT Frequency: 5x weekly OT  Frequency: 5x weekly            Contractures Contractures Info: Not present    Additional Factors Info  Allergies, Code Status Code Status Info: full Allergies Info: NKDA           Current Medications (10/21/2021):  This is the current hospital active medication list Current Facility-Administered Medications  Medication Dose Route Frequency Provider Last Rate Last Admin   acetaminophen (TYLENOL) tablet 1,000 mg  1,000 mg Oral TID Vann, Jessica U, DO   1,000 mg at 10/21/21 1100   amLODipine (NORVASC) tablet 10 mg  10 mg Oral Daily Thyra Breed A, PA-C   10 mg at 10/21/21 1100   ascorbic acid (VITAMIN C) tablet 500 mg  500 mg Oral Daily Montez Morita, PA-C   500 mg at 10/21/21 1059   aspirin EC tablet 81 mg  81 mg Oral BID West Bali, PA-C   81 mg at 10/21/21 1100   atorvastatin (LIPITOR) tablet 20 mg  20 mg Oral Daily Thyra Breed A, PA-C   20 mg at 10/21/21 1100   buPROPion [facility]) tablet 75 mg  75 mg Oral Daily West Bali, PA-C   75 mg at 10/21/21 1100   carvedilol (COREG) tablet 6.25 mg  6.25 mg Oral BID WC Thyra Breed A, PA-C   6.25 mg at 10/21/21 1856   cholecalciferol (VITAMIN D3) tablet 2,000 Units  2,000 Units Oral BID Montez Morita, PA-C   2,000 Units at 10/21/21 1059   diphenhydrAMINE (BENADRYL) 12.5 MG/5ML elixir  12.5-25 mg  12.5-25 mg Oral Q4H PRN West Bali, PA-C       enoxaparin (LOVENOX) injection 30 mg  30 mg Subcutaneous Q24H Montez Morita, PA-C   30 mg at 10/21/21 1327   feeding supplement (ENSURE ENLIVE / ENSURE PLUS) liquid 237 mL  237 mL Oral BID BM Wouk, Wilfred Curtis, MD   237 mL at 10/21/21 1117   levothyroxine (SYNTHROID) tablet 50 mcg  50 mcg Oral Q0600 West Bali, PA-C   50 mcg at 10/21/21 0500   metoCLOPramide (REGLAN) tablet 5-10 mg  5-10 mg Oral Q8H PRN West Bali, PA-C       Or   metoCLOPramide (REGLAN) injection 5-10 mg  5-10 mg Intravenous Q8H PRN Sharon Seller, Sarah A, PA-C       multivitamin with minerals tablet 1  tablet  1 tablet Oral Daily Wouk, Wilfred Curtis, MD   1 tablet at 10/21/21 1058   ondansetron (ZOFRAN) tablet 4 mg  4 mg Oral Q6H PRN West Bali, PA-C       Or   ondansetron (ZOFRAN) injection 4 mg  4 mg Intravenous Q6H PRN McClung, Sarah A, PA-C       oxyCODONE (Oxy IR/ROXICODONE) immediate release tablet 5 mg  5 mg Oral Q4H PRN Wouk, Wilfred Curtis, MD       pantoprazole (PROTONIX) EC tablet 40 mg  40 mg Oral Daily Thyra Breed A, PA-C   40 mg at 10/21/21 1059   polyethylene glycol (MIRALAX / GLYCOLAX) packet 17 g  17 g Oral Daily Wouk, Wilfred Curtis, MD       senna [facility]) tablet 8.6 mg  1 tablet Oral QHS Wouk, Wilfred Curtis, MD         Discharge Medications: Please see discharge summary for a list of discharge medications.  Relevant Imaging Results:  Relevant Lab Results:   Additional Information SSN: [id number]  Windle Guard, LCSW

## 2021-10-21 NOTE — Progress Notes (Signed)
Called ortho tech and ordered zero knee bone foam. It will be delivered.

## 2021-10-22 ENCOUNTER — Encounter (HOSPITAL_COMMUNITY): Payer: Self-pay | Admitting: Student

## 2021-10-22 MED ORDER — ASPIRIN EC 81 MG PO TBEC
81.0000 mg | DELAYED_RELEASE_TABLET | Freq: Every day | ORAL | Status: DC
  Administered 2021-10-22 – 2021-10-23 (×2): 81 mg via ORAL
  Filled 2021-10-22 (×2): qty 1

## 2021-10-22 MED ORDER — VITAMIN D3 25 MCG PO TABS: 2000.0000 [IU] | ORAL_TABLET | Freq: Two times a day (BID) | ORAL | 2 refills | Status: AC

## 2021-10-22 MED ORDER — OXYCODONE HCL 5 MG PO TABS: 5.0000 mg | ORAL_TABLET | ORAL | 0 refills | Status: DC | PRN

## 2021-10-22 MED ORDER — ASPIRIN 325 MG PO TBEC: 325.0000 mg | DELAYED_RELEASE_TABLET | Freq: Two times a day (BID) | ORAL | 0 refills | Status: AC

## 2021-10-22 NOTE — Anesthesia Postprocedure Evaluation (Signed)
Anesthesia Post Note  Patient: Claire Mccarthy  Procedure(s) Performed: OPEN REDUCTION INTERNAL FIXATION (ORIF) LEFT TIBIAL PLATEAU FRACTURE (Left: Leg Lower)     Patient location during evaluation: PACU Anesthesia Type: MAC and Spinal Level of consciousness: awake and alert Pain management: pain level controlled Vital Signs Assessment: post-procedure vital signs reviewed and stable Respiratory status: spontaneous breathing, nonlabored ventilation, respiratory function stable and patient connected to nasal cannula oxygen Cardiovascular status: stable and blood pressure returned to baseline Postop Assessment: no apparent nausea or vomiting Anesthetic complications: no   No notable events documented.  Last Vitals:  Vitals:   10/21/21 2313 10/22/21 0402  BP: 116/62 127/61  Pulse: 72 69  Resp: 15 14  Temp: 36.9 C 36.7 C  SpO2: 93% 93%    Last Pain:  Vitals:   10/22/21 0402  TempSrc: Oral  PainSc:                  March Rummage Byan [place]i

## 2021-10-22 NOTE — Discharge Instructions (Signed)
Orthopaedic Trauma Service Discharge Instructions   General Discharge Instructions  WEIGHT BEARING STATUS:Weightbearing for transfers only left lower extremity. No walker ambulation at this time.  RANGE OF MOTION/ACTIVITY: Ok for unrestricted knee range of motion in the hinge brace.   Wound Care: Leave dressing in place. You may shower, keep dressing covered. You may remove hinged knee brace for showering.   DVT/PE prophylaxis: Aspirin 325 mg twice daily x 30 days  Diet: as you were eating previously.  Can use over the counter stool softeners and bowel preparations, such as Miralax, to help with bowel movements.  Narcotics can be constipating.  Be sure to drink plenty of fluids  PAIN MEDICATION USE AND EXPECTATIONS  You have likely been given narcotic medications to help control your pain.  After a traumatic event that results in an fracture (broken bone) with or without surgery, it is ok to use narcotic pain medications to help control one's pain.  We understand that everyone responds to pain differently and each individual patient will be evaluated on a regular basis for the continued need for narcotic medications. Ideally, narcotic medication use should last no more than 6-8 weeks (coinciding with fracture healing).   As a patient it is your responsibility as well to monitor narcotic medication use and report the amount and frequency you use these medications when you come to your office visit.   We would also advise that if you are using narcotic medications, you should take a dose prior to therapy to maximize you participation.  IF YOU ARE ON NARCOTIC MEDICATIONS IT IS NOT PERMISSIBLE TO OPERATE A MOTOR VEHICLE (MOTORCYCLE/CAR/TRUCK/MOPED) OR HEAVY MACHINERY DO NOT MIX NARCOTICS WITH OTHER CNS (CENTRAL NERVOUS SYSTEM) DEPRESSANTS SUCH AS ALCOHOL   STOP SMOKING OR USING NICOTINE PRODUCTS!!!!  As discussed nicotine severely impairs your body's ability to heal surgical and traumatic  wounds but also impairs bone healing.  Wounds and bone heal by forming microscopic blood vessels (angiogenesis) and nicotine is a vasoconstrictor (essentially, shrinks blood vessels).  Therefore, if vasoconstriction occurs to these microscopic blood vessels they essentially disappear and are unable to deliver necessary nutrients to the healing tissue.  This is one modifiable factor that you can do to dramatically increase your chances of healing your injury.    (This means no smoking, no nicotine gum, patches, etc)  DO NOT USE NONSTEROIDAL ANTI-INFLAMMATORY DRUGS (NSAID'S)  Using products such as Advil (ibuprofen), Aleve (naproxen), Motrin (ibuprofen) for additional pain control during fracture healing can delay and/or prevent the healing response.  If you would like to take over the counter (OTC) medication, Tylenol (acetaminophen) is ok.  However, some narcotic medications that are given for pain control contain acetaminophen as well. Therefore, you should not exceed more than 4000 mg of tylenol in a day if you do not have liver disease.  Also note that there are may OTC medicines, such as cold medicines and allergy medicines that my contain tylenol as well.  If you have any questions about medications and/or interactions please ask your doctor/PA or your pharmacist.      ICE AND ELEVATE INJURED/OPERATIVE EXTREMITY  Using ice and elevating the injured extremity above your heart can help with swelling and pain control.  Icing in a pulsatile fashion, such as 20 minutes on and 20 minutes off, can be followed.    Do not place ice directly on skin. Make sure there is a barrier between to skin and the ice pack.    Using frozen items  such as frozen peas works well as the conform nicely to the are that needs to be iced.  USE AN ACE WRAP OR TED HOSE FOR SWELLING CONTROL  In addition to icing and elevation, Ace wraps or TED hose are used to help limit and resolve swelling.  It is recommended to use Ace wraps or  TED hose until you are informed to stop.    When using Ace Wraps start the wrapping distally (farthest away from the body) and wrap proximally (closer to the body)   Example: If you had surgery on your leg or thing and you do not have a splint on, start the ace wrap at the toes and work your way up to the thigh        If you had surgery on your upper extremity and do not have a splint on, start the ace wrap at your fingers and work your way up to the upper arm   [place]: [phone]   VISIT OUR WEBSITE FOR ADDITIONAL INFORMATION: [email or web address]   Discharge Wound Care Instructions  Do NOT apply any ointments, solutions or lotions to pin sites or surgical wounds.  These prevent needed drainage and even though solutions like hydrogen peroxide kill bacteria, they also damage cells lining the pin sites that help fight infection.  Applying lotions or ointments can keep the wounds moist and can cause them to breakdown and open up as well. This can increase the risk for infection. When in doubt call the office.  Surgical incisions should be dressed daily.  If any drainage is noted, use one layer of adaptic, then gauze, Kerlix, and an ace wrap.  Once the incision is completely dry and without drainage, it may be left open to air out.  Showering may begin 36-48 hours later.  Cleaning gently with soap and water.

## 2021-10-22 NOTE — Progress Notes (Signed)
°  Progress Note   Patient: Claire Mccarthy [id number] DOB: 07-13-1925 DOA: 10/18/2021     4 DOS: the patient was seen and examined on 10/22/2021   Brief hospital course: Claire Mccarthy is a 88 y.o. female with medical history significant of hypertension, hyperlipidemia hypothyroidism, depression, GERD, CVA who presented after a fall.  Found to have tib/fib fx.  Went to the OR on 1/27.  Now waiting for SNF.    Assessment and Plan Osteoporosis- (present on admission) - vit D levels low: supplement  Hypothyroidism- (present on admission) - Continue home Synthroid  HTN (hypertension)- (present on admission) Continue home amlodipine and carvedilol  HLD (hyperlipidemia)- (present on admission) Continue home atorvastatin  Closed fracture of left tibial plateau- (present on admission) Orthopedic consulted at [facility] and recommendation to transfer to [facility] for trauma orthopedic surgeon evaluation given comminuted and displaced nature fracture. S/p ORIF on 1/27: Weight-bear as tolerated left leg for transfers only               Unrestricted ROM L knee                No walker immobilization for the next 4 weeks               Ice and elevate for swelling and pain control  -PT/OT eval advising snf. TOC aware and has started bed search -pain controlled: scheduled tylenol, PRN oxy    Subjective: Seen and examined.  Very hard of hearing.  Daughter is at the bedside.  Patient has no complaints.  They are in contact with social worker and working to find a placement.  Objective Vitals:   10/21/21 1913 10/21/21 2313 10/22/21 0402 10/22/21 0700  BP: (!) 106/53 116/62 127/61 124/73  Pulse: 89 72 69 79  Resp: 19 15 14 19   Temp: 98.2 F (36.8 C) 98.4 F (36.9 C) 98.1 F (36.7 C) 98.8 F (37.1 C)  TempSrc: Oral Oral Oral Oral  SpO2: 94% 93% 93% 95%     General exam: Appears calm and comfortable  Respiratory system: Clear to auscultation. Respiratory effort normal. Cardiovascular  system: S1 & S2 heard, RRR. No JVD, murmurs, rubs, gallops or clicks. No pedal edema. Gastrointestinal system: Abdomen is nondistended, soft and nontender. No organomegaly or masses felt. Normal bowel sounds heard. Central nervous system: Alert and oriented. No focal neurological deficits. Skin: No rashes, lesions or ulcers.  Psychiatry: Judgement and insight appear normal. Mood & affect appropriate.    Family Communication: 2 daughters at bedside  Disposition: Status is: Inpatient  Remains inpatient appropriate because: Awaiting SNF placement.  Medically stable and ready for discharge.    Time spent: 30 minutes  Author: Darliss Cheney, MD 10/22/2021 10:16 AM  For on call review [email or web address].

## 2021-10-22 NOTE — TOC Progression Note (Signed)
Transition of Care [facility]) - Progression Note    Patient Details  Name: DUSTINE BERTINI MRN: [id number] Date of Birth: [birth year]  [facility] CM/SW Contact  Eduard Roux, [place] Phone Number: 10/22/2021, 3:55 PM  Clinical Narrative:     CSW spoke with patient's family, Claris Che and [place]. CSW introduced self and explained role. CSW confirmed with family preferred SNFs. Family requested patient be discharged to SNF as early as possible to prevent her leaving the hospital too late in the evening. CSW explained PTAR process.   [facility] - confirmed availability- they can admit patient tomorrow  Covid test requested   CSW will continue to follow and assist with discharge planning.   Antony Blackbird, MSW, LCSW Clinical Social Worker    Expected Discharge Plan: Skilled Nursing Facility Barriers to Discharge: SNF Pending bed offer  Expected Discharge Plan and Services Expected Discharge Plan: Skilled Nursing Facility     Post Acute Care Choice: Skilled Nursing Facility Living arrangements for the past 2 months: Single Family Home                                       Social Determinants of Health (SDOH) Interventions    Readmission Risk Interventions No flowsheet data found.

## 2021-10-22 NOTE — Progress Notes (Signed)
Physical Therapy Treatment Patient Details Name: Claire Mccarthy MRN: [id number] DOB: July 29, 1925 Today's Date: 10/22/2021   History of Present Illness 88 y/o female admitted 1/26 s/p ground level fall with left proximal tibia and fibula fracture.  ORIF 1/27.    PT Comments    Pt admitted with above diagnosis. Pt was able to perform exercises with left LE today. Pt tolerated well. Also stood to RW with +2 assist with continued difficulty with transfers due to inability to place weight on left LE to unweight the right LE to move it.   Pt currently with functional limitations due to balance and endurance deficits. Pt will benefit from skilled PT to increase their independence and safety with mobility to allow discharge to the venue listed below.      Recommendations for follow up therapy are one component of a multi-disciplinary discharge planning process, led by the attending physician.  Recommendations may be updated based on patient status, additional functional criteria and insurance authorization.  Follow Up Recommendations  Skilled nursing-short term rehab (<3 hours/day)     Assistance Recommended at Discharge Frequent or constant Supervision/Assistance  Patient can return home with the following Two people to help with walking and/or transfers;Two people to help with bathing/dressing/bathroom   Equipment Recommendations  None recommended by PT    Recommendations for Other Services       Precautions / Restrictions Precautions Precautions: Fall Required Braces or Orthoses: Other Brace (hinged (unlocked) brace in place; remove with PT to do unrestricted ROM) Knee Immobilizer - Left: On at all times Restrictions LLE Weight Bearing: Weight bearing as tolerated Other Position/Activity Restrictions: TRANSFERS  ONLY for 4 weeks     Mobility  Bed Mobility Overal bed mobility: Needs Assistance Bed Mobility: Supine to Sit     Supine to sit: Mod assist, +2 for physical assistance      General bed mobility comments: Needed assist for LEs and trunk    Transfers Overall transfer level: Needs assistance   Transfers: Bed to chair/wheelchair/BSC, Sit to/from Stand Sit to Stand: Mod assist, +2 physical assistance   Step pivot transfers: Max assist, +2 physical assistance       General transfer comment: Pt sit to stand to RW with mod assist of 2. Pt not standing all the way up and needed cues and assist.  Pt was able to move the left LE slightly however could not move the right LE therefore assisted with max assist to chair. Pt could not unweight the right LE well enough to  move it. . Pt did scoot back in the chair with cues and needed assist to get hips all the way back.    Ambulation/Gait               General Gait Details: Pt is transfers only per order   Stairs             Wheelchair Mobility    Modified Rankin (Stroke Patients Only)       Balance Overall balance assessment: Needs assistance Sitting-balance support: No upper extremity supported, Feet supported, Bilateral upper extremity supported Sitting balance-Leahy Scale: Poor Sitting balance - Comments: relies on UE support to sit EOB, Initiailly was mod assist to Sit EOB however progressed to min guard assist with UE support.   Standing balance support: Bilateral upper extremity supported, During functional activity, Reliant on assistive device for balance Standing balance-Leahy Scale: Poor Standing balance comment: relies on RW with mod assist of 2 for static stance.  Cognition Arousal/Alertness: Awake/alert Behavior During Therapy: WFL for tasks assessed/performed Overall Cognitive Status: Within Functional Limits for tasks assessed                                          Exercises General Exercises - Lower Extremity Ankle Circles/Pumps: AAROM, Both, 5 reps, Supine Quad Sets: AAROM, Both, 5 reps, Supine Heel Slides:  Left, AAROM, 5 reps, Supine Hip ABduction/ADduction: AAROM, 10 reps, Left, Supine Straight Leg Raises: AAROM, Left, 10 reps, Supine    General Comments General comments (skin integrity, edema, etc.): VSS with HR raised to 120 bpm at times.      Pertinent Vitals/Pain Pain Assessment Pain Assessment: Faces Faces Pain Scale: Hurts even more Pain Location: left LE Pain Descriptors / Indicators: Grimacing, Guarding, Aching Pain Intervention(s): Limited activity within patient's tolerance, Monitored during session, Repositioned    Home Living                          Prior Function            PT Goals (current goals can now be found in the care plan section) Acute Rehab PT Goals Patient Stated Goal: to go to rehab and then home Progress towards PT goals: Progressing toward goals    Frequency    Min 3X/week      PT Plan Current plan remains appropriate    Co-evaluation PT/OT/SLP Co-Evaluation/Treatment: Yes Reason for Co-Treatment: Complexity of the patient's impairments (multi-system involvement);For patient/therapist safety PT goals addressed during session: Mobility/safety with mobility        AM-PAC PT "6 Clicks" Mobility   Outcome Measure  Help needed turning from your back to your side while in a flat bed without using bedrails?: Total Help needed moving from lying on your back to sitting on the side of a flat bed without using bedrails?: Total Help needed moving to and from a bed to a chair (including a wheelchair)?: Total Help needed standing up from a chair using your arms (e.g., wheelchair or bedside chair)?: Total Help needed to walk in hospital room?: Total Help needed climbing 3-5 steps with a railing? : Total 6 Click Score: 6    End of Session Equipment Utilized During Treatment: Gait belt Activity Tolerance: Patient limited by fatigue;Patient limited by pain Patient left: in chair;with call bell/phone within reach;with chair alarm set;with  family/visitor present Nurse Communication: Mobility status (use of pad and scoot with arm of chair dropped) PT Visit Diagnosis: Unsteadiness on feet (R26.81);Muscle weakness (generalized) (M62.81);Pain Pain - Right/Left: Left Pain - part of body: Leg     Time: 0931-1005 PT Time Calculation (min) (ACUTE ONLY): 34 min  Charges:  $Therapeutic Exercise: 8-22 mins                     Claire Mccarthy M,PT Acute Rehab Services [phone] (pager)    Claire Mccarthy 10/22/2021, 10:54 AM

## 2021-10-22 NOTE — TOC Progression Note (Signed)
Transition of Care [facility]) - Progression Note    Patient Details  Name: [name] MRN: [id number] Date of Birth: Aug 02, 1925  Transition of Care [facility] CM/SW Contact  Eduard Roux, [place] Phone Number: 10/22/2021, 11:00 AM  Clinical Narrative:    Called [facility]- left voice message  Antony Blackbird, MSW, LCSW Clinical Social Worker    Expected Discharge Plan: Skilled Nursing Facility Barriers to Discharge: SNF Pending bed offer  Expected Discharge Plan and Services Expected Discharge Plan: Skilled Nursing Facility     Post Acute Care Choice: Skilled Nursing Facility Living arrangements for the past 2 months: Single Family Home                                       Social Determinants of Health (SDOH) Interventions    Readmission Risk Interventions No flowsheet data found.

## 2021-10-22 NOTE — Evaluation (Signed)
Occupational Therapy Evaluation Patient Details Name: Claire Mccarthy [place] MRN: [id number] DOB: 07/29/1925 Today's Date: 10/22/2021   History of Present Illness 88 y/o female admitted 1/26 s/p ground level fall with left proximal tibia and fibula fracture.  ORIF 1/27.   Clinical Impression   Pt typically is modified independent for ADL and functional transfers. Today she is mod A+2 for bed mobility. She did well "warming up" the LLE prior to bed mobility and transfer to recliner. Pt was able to perform sit<>stand with mod A +2 using RW, and max A +2 for SPT to recliner. Once in the recliner, set up for grooming. Pt is currently max A for LB ADL (+2 if transfer involved) and set up for most UB ADL. At this time recommending SNF post-acute to maximize safety and independence in ADL and functional transfers - she is transfer only for the next 4 weeks after which she will be able to start working on more ambulation. She has excellent support from daughters.       Recommendations for follow up therapy are one component of a multi-disciplinary discharge planning process, led by the attending physician.  Recommendations may be updated based on patient status, additional functional criteria and insurance authorization.   Follow Up Recommendations  Skilled nursing-short term rehab (<3 hours/day)    Assistance Recommended at Discharge Frequent or constant Supervision/Assistance  Patient can return home with the following Two people to help with walking and/or transfers;A lot of help with bathing/dressing/bathroom;Assistance with cooking/housework;Assist for transportation;Help with stairs or ramp for entrance    Functional Status Assessment  Patient has had a recent decline in their functional status and demonstrates the ability to make significant improvements in function in a reasonable and predictable amount of time.  Equipment Recommendations  BSC/3in1    Recommendations for Other Services        Precautions / Restrictions Precautions Precautions: Fall Required Braces or Orthoses: Other Brace (hinged (unlocked) brace in place; remove with PT to do unrestricted ROM) Knee Immobilizer - Left: On at all times Restrictions Weight Bearing Restrictions: Yes LLE Weight Bearing: Weight bearing as tolerated Other Position/Activity Restrictions: TRANSFERS  ONLY for 4 weeks      Mobility Bed Mobility Overal bed mobility: Needs Assistance Bed Mobility: Supine to Sit     Supine to sit: Mod assist, +2 for physical assistance     General bed mobility comments: Needed assist for LEs and trunk    Transfers Overall transfer level: Needs assistance   Transfers: Bed to chair/wheelchair/BSC, Sit to/from Stand Sit to Stand: Mod assist, +2 physical assistance     Step pivot transfers: Max assist, +2 physical assistance     General transfer comment: Pt sit to stand to RW with mod assist of 2. Pt not standing all the way up and needed cues and assist.  Pt was able to move the left LE slightly however could not move the right LE therefore assisted with max assist to chair. Pt could not unweight the right LE well enough to pivot without +2 assist. Pt did scoot back in the chair with cues and needed assist to get hips all the way back.      Balance Overall balance assessment: Needs assistance Sitting-balance support: No upper extremity supported, Feet supported, Bilateral upper extremity supported Sitting balance-Leahy Scale: Poor Sitting balance - Comments: relies on UE support to sit EOB, Initiailly was mod assist to Sit EOB however progressed to min guard assist with UE support.   Standing balance  support: Bilateral upper extremity supported, During functional activity, Reliant on assistive device for balance Standing balance-Leahy Scale: Poor Standing balance comment: relies on RW with mod assist of 2 for static stance.                           ADL either performed or  assessed with clinical judgement   ADL Overall ADL's : Needs assistance/impaired Eating/Feeding: Set up;Sitting   Grooming: Wash/dry face;Oral care;Set up;Sitting   Upper Body Bathing: Moderate assistance Upper Body Bathing Details (indicate cue type and reason): for back Lower Body Bathing: Maximal assistance;Sitting/lateral leans   Upper Body Dressing : Minimal assistance;Sitting Upper Body Dressing Details (indicate cue type and reason): EOB Lower Body Dressing: Maximal assistance   Toilet Transfer: Maximal assistance;+2 for physical assistance;+2 for safety/equipment;Stand-pivot   Toileting- Clothing Manipulation and Hygiene: Maximal assistance;+2 for physical assistance;+2 for safety/equipment;Sit to/from stand       Functional mobility during ADLs: Maximal assistance;+2 for physical assistance;+2 for safety/equipment;Rolling walker (2 wheels);Cueing for safety;Cueing for sequencing General ADL Comments: decreased access to LB for ADL, increased need for assist during transfers     Vision         Perception     Praxis      Pertinent Vitals/Pain Pain Assessment Pain Assessment: Faces Faces Pain Scale: Hurts even more Pain Location: left LE Pain Descriptors / Indicators: Grimacing, Guarding, Aching Pain Intervention(s): Limited activity within patient's tolerance, Monitored during session, Repositioned     Hand Dominance Right   Extremity/Trunk Assessment Upper Extremity Assessment Upper Extremity Assessment: Generalized weakness (WFL for 88 y/o woman)       Cervical / Trunk Assessment Cervical / Trunk Assessment: Kyphotic   Communication Communication Communication: HOH (VERY)   Cognition Arousal/Alertness: Awake/alert Behavior During Therapy: WFL for tasks assessed/performed Overall Cognitive Status: Within Functional Limits for tasks assessed                                       General Comments  VSS with HR raised to 120 bpm at  times.    Exercises     Shoulder Instructions      Home Living Family/patient expects to be discharged to:: Private residence Living Arrangements: Children (goes back and forth each month living with daughters) Available Help at Discharge: Family;Available 24 hours/day Type of Home: House Home Access: Stairs to enter     Home Layout: Multi-level;Bed/bath upstairs Alternate Level Stairs-Number of Steps: flight (has a stair lift chair)   Bathroom Shower/Tub: Chief Strategy Officer: Standard     Home Equipment: Teacher, English as a foreign language (2 wheels);Wheelchair - manual (MD ordered them a WC about 9 months ago that is too big)   Additional Comments: both daughter present today      Prior Functioning/Environment Prior Level of Function : Independent/Modified Independent             Mobility Comments: used RW PRN          OT Problem List: Decreased range of motion;Decreased activity tolerance;Impaired balance (sitting and/or standing);Decreased knowledge of use of DME or AE;Decreased knowledge of precautions;Pain      OT Treatment/Interventions: Self-care/ADL training;Therapeutic exercise;DME and/or AE instruction;Therapeutic activities;Patient/family education;Balance training    OT Goals(Current goals can be found in the care plan section) Acute Rehab OT Goals Patient Stated Goal: get back to walking OT Goal Formulation: With patient/family  Time For Goal Achievement: 11/05/21 Potential to Achieve Goals: Good  OT Frequency: Min 2X/week    Co-evaluation PT/OT/SLP Co-Evaluation/Treatment: Yes Reason for Co-Treatment: Complexity of the patient's impairments (multi-system involvement);For patient/therapist safety;To address functional/ADL transfers PT goals addressed during session: Mobility/safety with mobility;Balance;Proper use of DME;Strengthening/ROM OT goals addressed during session: ADL's and self-care;Strengthening/ROM;Proper use of Adaptive equipment  and DME      AM-PAC OT "6 Clicks" Daily Activity     Outcome Measure Help from another person eating meals?: None Help from another person taking care of personal grooming?: A Little Help from another person toileting, which includes using toliet, bedpan, or urinal?: A Lot Help from another person bathing (including washing, rinsing, drying)?: A Lot Help from another person to put on and taking off regular upper body clothing?: A Little Help from another person to put on and taking off regular lower body clothing?: A Lot 6 Click Score: 16   End of Session Equipment Utilized During Treatment: Gait belt;Rolling walker (2 wheels);Other (comment) (L hinged knee brace) Nurse Communication: Mobility status;Other (comment);Patient requests pain meds (use bed pad to scoot her back to bed)  Activity Tolerance: Patient tolerated treatment well Patient left: in chair;with call bell/phone within reach;with family/visitor present  OT Visit Diagnosis: Unsteadiness on feet (R26.81);Other abnormalities of gait and mobility (R26.89);History of falling (Z91.81);Muscle weakness (generalized) (M62.81);Pain Pain - Right/Left: Left Pain - part of body: Leg                Time: 0931-1005 OT Time Calculation (min): 34 min Charges:  OT General Charges $OT Visit: 1 Visit OT Evaluation $OT Eval Moderate Complexity: 1 Mod  Nyoka Cowden OTR/L Acute Rehabilitation Services Pager: [phone] Office: [phone]  Evern Bio Evalina Tabak 10/22/2021, 11:36 AM

## 2021-10-22 NOTE — Progress Notes (Signed)
Orthopaedic Trauma Progress Note  SUBJECTIVE: Sleeping comfortably this morning.  Ortho issues stable.  No other issues of note. Daughter at bedside  OBJECTIVE:  Vitals:   10/22/21 0402 10/22/21 0700  BP: 127/61 124/73  Pulse: 69 79  Resp: 14 19  Temp: 98.1 F (36.7 C) 98.8 F (37.1 C)  SpO2: 93% 95%    General: Sitting up in bed, no acute distress Respiratory: No increased work of breathing.  Left lower extremity: Hinged brace in place.  Dressing removed, incisions stable. Significant bruising to the lower leg. Swelling to the area much improved. Tolerates gentle ankle DF/PF. Able to wiggle toes. Endorses sensation to light touch distally. +DP pulse  IMAGING: Stable post op imaging.   LABS: No results found for this or any previous visit (from the past 24 hour(s)).  ASSESSMENT: IZABEL CHIM is a 88 y.o. female, 3 Days Post-Op s/p ORIF LEFT TIBIAL PLATEAU FRACTURE  CV/Blood loss: Acute blood loss anemia, Hgb 8.0 on 10/21/2021. Hemodynamically stable. Repeat CBC ordered for AM 10/23/21  PLAN: Weightbearing: WBAT LLE for transfers only.  No walker ambulation on LLE x4 weeks ROM: No range of motion restrictions and brace Incisional and dressing care: Reinforce dressings as needed  Showering: Okay to shower with assistance, keep dressing clean and dry Orthopedic device(s):  Hinged knee brace to be worn at all times except when showering. Zero degree foam Pain management:  1. Tylenol 1000 mg 3 times daily 2. Oxycodone 5 mg q 4 hours PRN VTE prophylaxis: Lovenox - renal dose, SCDs ID:  Ancef 2gm post op completed Foley/Lines:  No foley, KVO IVFs Impediments to Fracture Healing: Vitamin D level 20, started on supplementation Dispo: Therapies as tolerated, PT/OT recommending SNF.  TOC following for bed placement.  We will plan to transition patient to aspirin 325 mg twice daily x30 days at discharge for DVT prophylaxis. Okay for discharge from ortho standpoint once cleared by  medicine team and therapies. D/c rx for pain medication and DVT prophylaxis have been signed and placed in patient's chart   Follow - up plan: 2 weeks  Contact information:  Truitt Merle MD, Thyra Breed PA-C. After hours and holidays please check [email or web address] for group call information for [facility]   Thompson Caul, PA-C [phone] (office) [email or web address]

## 2021-10-23 LAB — CBC
HCT: 26.2 % — ABNORMAL LOW (ref 36.0–46.0)
Hemoglobin: 8.8 g/dL — ABNORMAL LOW (ref 12.0–15.0)
MCH: 30.6 pg (ref 26.0–34.0)
MCHC: 33.6 g/dL (ref 30.0–36.0)
MCV: 91 fL (ref 80.0–100.0)
Platelets: 357 10*3/uL (ref 150–400)
RBC: 2.88 MIL/uL — ABNORMAL LOW (ref 3.87–5.11)
RDW: 14.6 % (ref 11.5–15.5)
WBC: 13.9 10*3/uL — ABNORMAL HIGH (ref 4.0–10.5)
nRBC: 0 % (ref 0.0–0.2)

## 2021-10-23 LAB — SARS CORONAVIRUS 2 (TAT 6-24 HRS): SARS Coronavirus 2: NEGATIVE

## 2021-10-23 NOTE — Care Management Important Message (Signed)
Important Message  Patient Details  Name: Claire Mccarthy MRN: [id number] Date of Birth: [birth year]   Medicare Important Message Given:  Yes     Hannah Beat 10/23/2021, 11:10 AM

## 2021-10-23 NOTE — Discharge Summary (Signed)
[name]  [name]:[id number] DOB: June 17, 1925 DOA: 10/18/2021  [facility]P: [facility]  Admit date: 10/18/2021 Discharge date: 10/23/2021 30 Day Unplanned Readmission Risk Score    Flowsheet Row Admission (Current) from 10/18/2021 in [facility]  30 Day Unplanned Readmission Risk Score (%) 13.92 Filed at 10/23/2021 0801       This score is the patient's risk of an unplanned readmission within 30 days of being discharged (0 -100%). The score is based on dignosis, age, lab data, medications, orders, and past utilization.   Low:  0-14.9   Medium: 15-21.9   High: 22-29.9   Extreme: 30 and above          Admitted From: Home Disposition: SNF  Recommendations for Outpatient Follow-up:  Follow up with PCP in 1-2 weeks Please obtain BMP/CBC in one week Follow-up with orthopedics in 2 weeks Please follow up with your PCP on the following pending results: Unresulted Labs (From admission, onward)    None         Home Health: None Equipment/Devices: None  Discharge Condition: Stable CODE STATUS: Full code Diet recommendation: Cardiac  Subjective: Seen and examined.  She has no complaints.  2 daughters at the bedside.  Brief/Interim Summary: Claire Mccarthy is a 88 y.o. female with medical history significant of hypertension, hyperlipidemia hypothyroidism, depression, GERD, CVA who presented after a fall.  Found to have left tib/fib fx. admitted under hospitalist service.  HTN (hypertension)- (present on admission) Continue home amlodipine and carvedilol   HLD (hyperlipidemia)- (present on admission) Continue home atorvastatin   Closed fracture of left tibial plateau- (present on admission) Orthopedic consulted at [facility] and recommendation to transfer to [facility] for trauma orthopedic surgeon evaluation given comminuted and displaced nature fracture. S/p ORIF on 1/27. PT/OT eval advising snf.  Patient  cleared for discharge.  SNF arranged.  Patient is being discharged in stable condition.  Aspirin for DVT prophylaxis and pain medications prescribed by orthopedics.  Discharge plan was discussed with patient and/or family member and they verbalized understanding and agreed with it.  Discharge Diagnoses:  Active Problems:   Closed fracture of left tibial plateau   HLD (hyperlipidemia)   HTN (hypertension)   Hypothyroidism   Personal history of transient ischemic attack (TIA), and cerebral infarction without residual deficits   Osteoporosis   Pathological fracture of ankle due to age-related osteoporosis    Discharge Instructions   Allergies as of 10/23/2021   No Known Allergies      Medication List     STOP taking these medications    HYDROcodone-acetaminophen 5-325 MG tablet Commonly known as: NORCO/VICODIN       TAKE these medications    acetaminophen 500 MG tablet Commonly known as: TYLENOL Take 500 mg by mouth 2 (two) times daily as needed for mild pain, fever or headache.   amLODipine 5 MG tablet Commonly known as: NORVASC Take 5 mg by mouth daily.   aspirin 325 MG EC tablet Take 1 tablet (325 mg total) by mouth in the morning and at bedtime. Swallow whole. What changed:  medication strength how much to take when to take this   atorvastatin 20 MG tablet Commonly known as: LIPITOR Take 20 mg by mouth daily.   bismuth subsalicylate 262 MG chewable tablet Commonly known as: PEPTO BISMOL Chew 524 mg by mouth 4 (four) times daily as needed for indigestion.   buPROPion 75 MG tablet Commonly known as: WELLBUTRIN Take  75 mg by mouth daily.   carvedilol 6.25 MG tablet Commonly known as: COREG Take 6.25 mg by mouth 2 (two) times daily.   dicyclomine 20 MG tablet Commonly known as: BENTYL Take 20 mg by mouth daily.   levothyroxine 50 MCG tablet Commonly known as: SYNTHROID Take 50 mcg by mouth daily.   oxyCODONE 5 MG immediate release  tablet Commonly known as: Oxy IR/ROXICODONE Take 1 tablet (5 mg total) by mouth every 4 (four) hours as needed for breakthrough pain.   Vitamin D3 25 MCG tablet Commonly known as: Vitamin D Take 2 tablets (2,000 Units total) by mouth 2 (two) times daily.        Follow-up Information     Haddix, Gillie MannersKevin P, MD. Go on 11/06/2021.   Specialty: Orthopedic Surgery Why: 11/06/21 at 2:45PM for repeat x-rays and wound check Contact information: [address] [place] [address] [phone]         [facility] Follow up in 1 week(s).   Specialty: General Practice Contact information: [address] [place] [address] [phone]                No Known Allergies  Consultations: Orthopedics   Procedures/Studies: DG Tibia/Fibula Left  Result Date: 10/19/2021 CLINICAL DATA:  ORIF left tibia EXAM: LEFT TIBIA AND FIBULA - 2 VIEW COMPARISON:  Left knee radiographs 10/17/2021 FINDINGS: Images were performed intraoperatively without the presence of a radiologist. The patient is undergoing lateral plate and screw fixation of the previously seen comminuted oblique proximal tibial metadiaphyseal fracture. Please see intraoperative findings for further detail. IMPRESSION: Proximal tibial ORIF. Electronically Signed   By: Neita Garnetonald  Viola M.D.   On: 10/19/2021 14:54   CT Knee Left Wo Contrast  Result Date: 10/18/2021 CLINICAL DATA:  Fracture. EXAM: CT OF THE left KNEE WITHOUT CONTRAST TECHNIQUE: Multidetector CT imaging of the left knee was performed according to the standard protocol. Multiplanar CT image reconstructions were also generated. RADIATION DOSE REDUCTION: This exam was performed according to the departmental dose-optimization program which includes automated exposure control, adjustment of the mA and/or kV according to patient size and/or use of iterative reconstruction technique. COMPARISON:  Earlier radiograph dated 10/17/2021.  FINDINGS: Bones/Joint/Cartilage There is a comminuted minimally displaced and impacted fracture of the fibular head and neck. There is an oblique and comminuted fracture of the proximal tibial metadiaphysis. There is approximately 5 mm lateral displacement of the distal fracture fragment and mild lateral angulation. There is severe osteopenia which limits evaluation for fracture. No definite dislocation. There is a small joint effusion. Ligaments Suboptimally assessed by CT. Muscles and Tendons No intramuscular hematoma. Soft tissues Advanced atherosclerotic calcification. Diffuse subcutaneous edema. No fluid collection. IMPRESSION: 1. Comminuted and displaced fractures of the proximal tibia and fibula. 2. Small joint effusion. Electronically Signed   By: Elgie CollardArash  Radparvar M.D.   On: 10/18/2021 02:36   Chest Portable 1 View  Result Date: 10/18/2021 CLINICAL DATA:  Leukocytosis. EXAM: PORTABLE CHEST 1 VIEW COMPARISON:  Chest x-ray 06/03/2013. FINDINGS: Patient is rotated. Cardiomediastinal silhouette is grossly within normal limits. There is no lung consolidation, pleural effusion or pneumothorax. No acute fractures are seen. IMPRESSION: 1. Technically limited study. 2. No definite acute cardiopulmonary process. Electronically Signed   By: Darliss CheneyAmy  Guttmann M.D.   On: 10/18/2021 23:13   DG Knee Complete 4 Views Left  Result Date: 10/17/2021 CLINICAL DATA:  Fall and trauma to the left knee. EXAM: LEFT KNEE - COMPLETE 4+ VIEW  COMPARISON:  None. FINDINGS: Evaluation is limited due to advanced osteopenia and by positioning. There is a mildly displaced fracture of the fibular neck. There is a oblique fracture of the proximal tibial metadiaphysis with lateral displacement of the distal fracture fragment. Possible rotation of the knee or partial subluxation. No significant joint effusion. The soft tissue swelling of the knee. IMPRESSION: 1. Mildly displaced fracture of the fibular neck and displaced oblique fracture of  the proximal tibial metadiaphysis. 2. Possible rotation or partial subluxation of the knee. Electronically Signed   By: Elgie Collard M.D.   On: 10/17/2021 23:19   DG Knee Left Port  Result Date: 10/19/2021 CLINICAL DATA:  Fracture left tibia and fibula EXAM: PORTABLE LEFT KNEE - 1-2 VIEW COMPARISON:  10/17/2021 FINDINGS: There is interval internal fixation of fracture of proximal shaft of tibia. There is interval improvement in alignment of fracture fragments. There is essentially undisplaced fracture in the neck of left fibula. Osteopenia is seen in bony structures. Arterial calcifications are seen in the soft tissues. IMPRESSION: Reduction and internal fixation of displaced fracture of proximal shaft of left tibia. Electronically Signed   By: Ernie Avena M.D.   On: 10/19/2021 16:25   DG C-Arm 1-60 Min-No Report  Result Date: 10/19/2021 Fluoroscopy was utilized by the requesting physician.  No radiographic interpretation.     Discharge Exam: Vitals:   10/23/21 0700 10/23/21 0809  BP: (!) 130/52 124/66  Pulse:  99  Resp:    Temp: (!) 97.4 F (36.3 C)   SpO2:     Vitals:   10/22/21 2327 10/23/21 0325 10/23/21 0700 10/23/21 0809  BP: 119/70 129/67 (!) 130/52 124/66  Pulse: 89 79  99  Resp: 16     Temp: 98.3 F (36.8 C) 99.4 F (37.4 C) (!) 97.4 F (36.3 C)   TempSrc: Oral Oral Oral   SpO2: 95% 94%      General: Pt is alert, awake, not in acute distress Cardiovascular: RRR, S1/S2 +, no rubs, no gallops Respiratory: CTA bilaterally, no wheezing, no rhonchi Abdominal: Soft, NT, ND, bowel sounds + Extremities: no edema, no cyanosis    The results of significant diagnostics from this hospitalization (including imaging, microbiology, ancillary and laboratory) are listed below for reference.     Microbiology: Recent Results (from the past 240 hour(s))  Resp Panel by RT-PCR (Flu A&B, Covid) Nasopharyngeal Swab     Status: None   Collection Time: 10/17/21 11:40 PM    Specimen: Nasopharyngeal Swab; Nasopharyngeal(NP) swabs in vial transport medium  Result Value Ref Range Status   SARS Coronavirus 2 by RT PCR NEGATIVE NEGATIVE Final    Comment: (NOTE) SARS-CoV-2 target nucleic acids are NOT DETECTED.  The SARS-CoV-2 RNA is generally detectable in upper respiratory specimens during the acute phase of infection. The lowest concentration of SARS-CoV-2 viral copies this assay can detect is 138 copies/mL. A negative result does not preclude SARS-Cov-2 infection and should not be used as the sole basis for treatment or other patient management decisions. A negative result may occur with  improper specimen collection/handling, submission of specimen other than nasopharyngeal swab, presence of viral mutation(s) within the areas targeted by this assay, and inadequate number of viral copies(<138 copies/mL). A negative result must be combined with clinical observations, patient history, and epidemiological information. The expected result is Negative.  Fact Sheet for Patients:  BloggerCourse.com  Fact Sheet for Healthcare Providers:  SeriousBroker.it  This test is no t yet approved or cleared by the [place]  States FDA and  has been authorized for detection and/or diagnosis of SARS-CoV-2 by FDA under an Emergency Use Authorization (EUA). This EUA will remain  in effect (meaning this test can be used) for the duration of the COVID-19 declaration under Section 564(b)(1) of the Act, 21 U.S.C.section 360bbb-3(b)(1), unless the authorization is terminated  or revoked sooner.       Influenza A by PCR NEGATIVE NEGATIVE Final   Influenza B by PCR NEGATIVE NEGATIVE Final    Comment: (NOTE) The Xpert Xpress SARS-CoV-2/FLU/RSV plus assay is intended as an aid in the diagnosis of influenza from Nasopharyngeal swab specimens and should not be used as a sole basis for treatment. Nasal washings and aspirates are  unacceptable for Xpert Xpress SARS-CoV-2/FLU/RSV testing.  Fact Sheet for Patients: BloggerCourse.com  Fact Sheet for Healthcare Providers: SeriousBroker.it  This test is not yet approved or cleared by the [place] FDA and has been authorized for detection and/or diagnosis of SARS-CoV-2 by FDA under an Emergency Use Authorization (EUA). This EUA will remain in effect (meaning this test can be used) for the duration of the COVID-19 declaration under Section 564(b)(1) of the Act, 21 U.S.C. section 360bbb-3(b)(1), unless the authorization is terminated or revoked.  Performed at [facility], [address] [place], [place] [address]   MRSA Next Gen by PCR, Nasal     Status: None   Collection Time: 10/19/21  9:49 AM   Specimen: Nasal Mucosa; Nasal Swab  Result Value Ref Range Status   MRSA by PCR Next Gen NOT DETECTED NOT DETECTED Final    Comment: (NOTE) The GeneXpert MRSA Assay (FDA approved for NASAL specimens only), is one component of a comprehensive MRSA colonization surveillance program. It is not intended to diagnose MRSA infection nor to guide or monitor treatment for MRSA infections. Test performance is not FDA approved in patients less than 38 years old. Performed at [facility], [address] [place], [place] [address]   SARS CORONAVIRUS 2 (TAT 6-24 HRS) Nasopharyngeal Nasopharyngeal Swab     Status: None   Collection Time: 10/22/21  5:08 PM   Specimen: Nasopharyngeal Swab  Result Value Ref Range Status   SARS Coronavirus 2 NEGATIVE NEGATIVE Final    Comment: (NOTE) SARS-CoV-2 target nucleic acids are NOT DETECTED.  The SARS-CoV-2 RNA is generally detectable in upper and lower respiratory specimens during the acute phase of infection. Negative results do not preclude SARS-CoV-2 infection, do not rule out co-infections with other pathogens, and should not be used as the sole basis for  treatment or other patient management decisions. Negative results must be combined with clinical observations, patient history, and epidemiological information. The expected result is Negative.  Fact Sheet for Patients: HairSlick.no  Fact Sheet for Healthcare Providers: quierodirigir.com  This test is not yet approved or cleared by the [place] FDA and  has been authorized for detection and/or diagnosis of SARS-CoV-2 by FDA under an Emergency Use Authorization (EUA). This EUA will remain  in effect (meaning this test can be used) for the duration of the COVID-19 declaration under Se ction 564(b)(1) of the Act, 21 U.S.C. section 360bbb-3(b)(1), unless the authorization is terminated or revoked sooner.  Performed at [facility], [address] [place], [place] [address]      Labs: BNP (last 3 results) No results for input(s): BNP in the last 8760 hours. Basic Metabolic Panel: Recent Labs  Lab 10/17/21 2340 10/18/21 1531 10/19/21 5638 10/20/21 0239 10/21/21 7564  NA 134* 134* 135 135 132*  K 4.3 3.9 4.0 4.0 3.9  CL 104 105 108 104 103  CO2 24 22 22 23 22   GLUCOSE 180* 149* 123* 116* 88  BUN 26* 21 19 20 22   CREATININE 0.84 0.58 0.68 0.62 0.81  CALCIUM 8.9 8.6* 8.2* 8.2* 7.6*   Liver Function Tests: Recent Labs  Lab 10/17/21 2340 10/21/21 0326  AST 20 23  ALT 15 10  ALKPHOS 59 42  BILITOT 0.5 0.6  PROT 6.6 4.8*  ALBUMIN 3.8 2.6*   No results for input(s): LIPASE, AMYLASE in the last 168 hours. No results for input(s): AMMONIA in the last 168 hours. CBC: Recent Labs  Lab 10/17/21 2340 10/18/21 1531 10/19/21 0428 10/20/21 0239 10/21/21 0326 10/23/21 0346  WBC 5.9 15.0* 14.4* 13.8* 14.8* 13.9*  NEUTROABS 4.8  --   --   --   --   --   HGB 11.0* 9.0* 8.5* 8.4* 8.0* 8.8*  HCT 32.5* 27.0* 24.7* 25.3* 24.5* 26.2*  MCV 90.0 90.3 88.8 89.7 90.4 91.0  PLT 357 328 284 303 298 357   Cardiac  Enzymes: No results for input(s): CKTOTAL, CKMB, CKMBINDEX, TROPONINI in the last 168 hours. BNP: Invalid input(s): POCBNP CBG: No results for input(s): GLUCAP in the last 168 hours. D-Dimer No results for input(s): DDIMER in the last 72 hours. Hgb A1c No results for input(s): HGBA1C in the last 72 hours. Lipid Profile No results for input(s): CHOL, HDL, LDLCALC, TRIG, CHOLHDL, LDLDIRECT in the last 72 hours. Thyroid function studies No results for input(s): TSH, T4TOTAL, T3FREE, THYROIDAB in the last 72 hours.  Invalid input(s): FREET3 Anemia work up No results for input(s): VITAMINB12, FOLATE, FERRITIN, TIBC, IRON, RETICCTPCT in the last 72 hours. Urinalysis    Component Value Date/Time   COLORURINE YELLOW 10/19/2021 0132   APPEARANCEUR CLEAR 10/19/2021 0132   APPEARANCEUR Clear 12/22/2014 1416   LABSPEC 1.013 10/19/2021 0132   LABSPEC 1.004 12/22/2014 1416   PHURINE 7.0 10/19/2021 0132   GLUCOSEU NEGATIVE 10/19/2021 0132   GLUCOSEU 50 mg/dL 10/21/2021 10/21/2021   HGBUR SMALL (A) 10/19/2021 0132   BILIRUBINUR NEGATIVE 10/19/2021 0132   BILIRUBINUR Negative 12/22/2014 1416   KETONESUR NEGATIVE 10/19/2021 0132   PROTEINUR NEGATIVE 10/19/2021 0132   NITRITE NEGATIVE 10/19/2021 0132   LEUKOCYTESUR MODERATE (A) 10/19/2021 0132   LEUKOCYTESUR 3+ 12/22/2014 1416   Sepsis Labs Invalid input(s): PROCALCITONIN,  WBC,  LACTICIDVEN Microbiology Recent Results (from the past 240 hour(s))  Resp Panel by RT-PCR (Flu A&B, Covid) Nasopharyngeal Swab     Status: None   Collection Time: 10/17/21 11:40 PM   Specimen: Nasopharyngeal Swab; Nasopharyngeal(NP) swabs in vial transport medium  Result Value Ref Range Status   SARS Coronavirus 2 by RT PCR NEGATIVE NEGATIVE Final    Comment: (NOTE) SARS-CoV-2 target nucleic acids are NOT DETECTED.  The SARS-CoV-2 RNA is generally detectable in upper respiratory specimens during the acute phase of infection. The lowest concentration of SARS-CoV-2  viral copies this assay can detect is 138 copies/mL. A negative result does not preclude SARS-Cov-2 infection and should not be used as the sole basis for treatment or other patient management decisions. A negative result may occur with  improper specimen collection/handling, submission of specimen other than nasopharyngeal swab, presence of viral mutation(s) within the areas targeted by this assay, and inadequate number of viral copies(<138 copies/mL). A negative result must be combined with clinical observations, patient history, and epidemiological information. The expected result is Negative.  Fact Sheet for Patients:  BloggerCourse.comhttps://www.fda.gov/media/152166/download  Fact Sheet for Healthcare Providers:  SeriousBroker.ithttps://www.fda.gov/media/152162/download  This test is no t yet approved or cleared by the [place] FDA and  has been authorized for detection and/or diagnosis of SARS-CoV-2 by FDA under an Emergency Use Authorization (EUA). This EUA will remain  in effect (meaning this test can be used) for the duration of the COVID-19 declaration under Section 564(b)(1) of the Act, 21 U.S.C.section 360bbb-3(b)(1), unless the authorization is terminated  or revoked sooner.       Influenza A by PCR NEGATIVE NEGATIVE Final   Influenza B by PCR NEGATIVE NEGATIVE Final    Comment: (NOTE) The Xpert Xpress SARS-CoV-2/FLU/RSV plus assay is intended as an aid in the diagnosis of influenza from Nasopharyngeal swab specimens and should not be used as a sole basis for treatment. Nasal washings and aspirates are unacceptable for Xpert Xpress SARS-CoV-2/FLU/RSV testing.  Fact Sheet for Patients: BloggerCourse.comhttps://www.fda.gov/media/152166/download  Fact Sheet for Healthcare Providers: SeriousBroker.ithttps://www.fda.gov/media/152162/download  This test is not yet approved or cleared by the [place] FDA and has been authorized for detection and/or diagnosis of SARS-CoV-2 by FDA under an Emergency Use Authorization  (EUA). This EUA will remain in effect (meaning this test can be used) for the duration of the COVID-19 declaration under Section 564(b)(1) of the Act, 21 U.S.C. section 360bbb-3(b)(1), unless the authorization is terminated or revoked.  Performed at [facility], [address] [place], [place] [address]   MRSA Next Gen by PCR, Nasal     Status: None   Collection Time: 10/19/21  9:49 AM   Specimen: Nasal Mucosa; Nasal Swab  Result Value Ref Range Status   MRSA by PCR Next Gen NOT DETECTED NOT DETECTED Final    Comment: (NOTE) The GeneXpert MRSA Assay (FDA approved for NASAL specimens only), is one component of a comprehensive MRSA colonization surveillance program. It is not intended to diagnose MRSA infection nor to guide or monitor treatment for MRSA infections. Test performance is not FDA approved in patients less than 526 years old. Performed at [facility], [address] [place], [place] [address]   SARS CORONAVIRUS 2 (TAT 6-24 HRS) Nasopharyngeal Nasopharyngeal Swab     Status: None   Collection Time: 10/22/21  5:08 PM   Specimen: Nasopharyngeal Swab  Result Value Ref Range Status   SARS Coronavirus 2 NEGATIVE NEGATIVE Final    Comment: (NOTE) SARS-CoV-2 target nucleic acids are NOT DETECTED.  The SARS-CoV-2 RNA is generally detectable in upper and lower respiratory specimens during the acute phase of infection. Negative results do not preclude SARS-CoV-2 infection, do not rule out co-infections with other pathogens, and should not be used as the sole basis for treatment or other patient management decisions. Negative results must be combined with clinical observations, patient history, and epidemiological information. The expected result is Negative.  Fact Sheet for Patients: HairSlick.nohttps://www.fda.gov/media/138098/download  Fact Sheet for Healthcare Providers: quierodirigir.comhttps://www.fda.gov/media/138095/download  This test is not yet approved or cleared by the  [place] FDA and  has been authorized for detection and/or diagnosis of SARS-CoV-2 by FDA under an Emergency Use Authorization (EUA). This EUA will remain  in effect (meaning this test can be used) for the duration of the COVID-19 declaration under Se ction 564(b)(1) of the Act, 21 U.S.C. section 360bbb-3(b)(1), unless the authorization is terminated or revoked sooner.  Performed at [facility], [address] [place], [place] [address]      Time coordinating discharge: Over 30 minutes  SIGNED:   Hughie Closs, MD  [facility] 10/23/2021, 8:39 AM  If 7PM-7AM, please contact night-coverage [email or web address]

## 2021-10-23 NOTE — TOC Transition Note (Addendum)
Transition of Care [facility]) - CM/SW Discharge Note   Patient Details  Name: Claire Mccarthy MRN: [id number] Date of Birth: March 03, 1925  [facility] CM/SW Contact:  Erin Sons, LCSW Phone Number: 10/23/2021, 10:55 AM   Clinical Narrative:     Patient will DC to: [facility]  Anticipated DC date: 10/23/21 Family notified: [name], Daughter Transport by: Sharin Mons   Per MD patient ready for DC to [facility] . RN, patient, patient's family, and facility notified of DC. Discharge Summary and FL2 sent to facility. RN to call report prior to discharge [phone]). DC packet on chart. Ambulance transport requested for patient.   CSW will sign off for now as social work intervention is no longer needed. Please consult [place] again if new needs arise.   Final next level of care: Skilled Nursing Facility Barriers to Discharge: No Barriers Identified   Patient Goals and CMS Choice Patient states their goals for this hospitalization and ongoing recovery are:: "If the doctor says rehab I'll go." CMS Medicare.gov Compare Post Acute Care list provided to:: Patient Choice offered to / list presented to : Patient, Adult Children  Discharge Placement              Patient chooses bed at: [facility] Patient to be transferred to facility by: [facility] Name of family member notified: [name] Daughter Patient and family notified of of transfer: 10/23/21  Discharge Plan and Services     Post Acute Care Choice: Skilled Nursing Facility                               Social Determinants of Health (SDOH) Interventions     Readmission Risk Interventions No flowsheet data found.

## 2022-08-20 ENCOUNTER — Emergency Department
Admission: EM | Admit: 2022-08-20 | Discharge: 2022-08-20 | Discharge status: Against Medical Advice | Payer: Medicare Other | Attending: Emergency Medicine | Admitting: Emergency Medicine

## 2022-08-20 ENCOUNTER — Other Ambulatory Visit: Payer: Self-pay

## 2022-08-20 ENCOUNTER — Encounter: Payer: Self-pay | Admitting: Emergency Medicine

## 2022-08-20 DIAGNOSIS — Z5321 Procedure and treatment not carried out due to patient leaving prior to being seen by health care provider: Secondary | ICD-10-CM | POA: Diagnosis not present

## 2022-08-20 DIAGNOSIS — R531 Weakness: Secondary | ICD-10-CM | POA: Diagnosis not present

## 2022-08-20 LAB — TROPONIN I (HIGH SENSITIVITY): Troponin I (High Sensitivity): 13 ng/L (ref <18)

## 2022-08-20 LAB — CBC
HCT: 36.8 % (ref 36.0–46.0)
Hemoglobin: 12.3 g/dL (ref 12.0–15.0)
MCH: 31 pg (ref 26.0–34.0)
MCHC: 33.4 g/dL (ref 30.0–36.0)
MCV: 92.7 fL (ref 80.0–100.0)
Platelets: 353 10*3/uL (ref 150–400)
RBC: 3.97 MIL/uL (ref 3.87–5.11)
RDW: 14.2 % (ref 11.5–15.5)
WBC: 11.6 10*3/uL — ABNORMAL HIGH (ref 4.0–10.5)
nRBC: 0 % (ref 0.0–0.2)

## 2022-08-20 LAB — BASIC METABOLIC PANEL
Anion gap: 6 (ref 5–15)
BUN: 20 mg/dL (ref 8–23)
CO2: 26 mmol/L (ref 22–32)
Calcium: 8.9 mg/dL (ref 8.9–10.3)
Chloride: 103 mmol/L (ref 98–111)
Creatinine, Ser: 0.79 mg/dL (ref 0.44–1.00)
GFR, Estimated: >60 mL/min (ref >60)
Glucose, Bld: 168 mg/dL — ABNORMAL HIGH (ref 70–99)
Potassium: 3.7 mmol/L (ref 3.5–5.1)
Sodium: 135 mmol/L (ref 135–145)

## 2022-08-20 NOTE — ED Triage Notes (Signed)
Patient to ED via [facility] from home for generalized weakness. Per daughter, patient was noticeably weaker today and not able to walk like normal. Had some trouble eating. Last time daughter saw her completely normal was yesterday.

## 2022-08-20 NOTE — ED Notes (Signed)
Lab called to add on troponin 

## 2024-01-22 DEATH — deceased
